# Patient Record
Sex: Female | Born: 1960
Health system: Southern US, Community
[De-identification: ages and names within clinical notes are randomized; demographics above are authoritative.]

## PROBLEM LIST (undated history)

## (undated) DIAGNOSIS — I319 Disease of pericardium, unspecified: Secondary | ICD-10-CM

## (undated) DIAGNOSIS — F419 Anxiety disorder, unspecified: Secondary | ICD-10-CM

## (undated) DIAGNOSIS — F329 Major depressive disorder, single episode, unspecified: Secondary | ICD-10-CM

## (undated) DIAGNOSIS — O99419 Diseases of the circulatory system complicating pregnancy, unspecified trimester: Secondary | ICD-10-CM

## (undated) DIAGNOSIS — M1990 Unspecified osteoarthritis, unspecified site: Secondary | ICD-10-CM

## (undated) DIAGNOSIS — L659 Nonscarring hair loss, unspecified: Secondary | ICD-10-CM

## (undated) DIAGNOSIS — F32A Depression, unspecified: Secondary | ICD-10-CM

## (undated) HISTORY — PX: BREAST BIOPSY: SHX20

## (undated) HISTORY — PX: SALIVARY GLAND SURGERY: SHX768

---

## 1990-12-13 HISTORY — PX: NASAL POLYP SURGERY: SHX186

## 1994-12-13 DIAGNOSIS — I319 Disease of pericardium, unspecified: Secondary | ICD-10-CM

## 1994-12-13 DIAGNOSIS — O99419 Diseases of the circulatory system complicating pregnancy, unspecified trimester: Secondary | ICD-10-CM

## 1994-12-13 HISTORY — DX: Disease of pericardium, unspecified: I31.9

## 1994-12-13 HISTORY — DX: Diseases of the circulatory system complicating pregnancy, unspecified trimester: O99.419

## 1998-12-13 HISTORY — PX: GANGLION CYST EXCISION: SHX1691

## 1999-01-30 ENCOUNTER — Other Ambulatory Visit: Admission: RE | Admit: 1999-01-30 | Discharge: 1999-01-30 | Payer: Self-pay | Admitting: Obstetrics and Gynecology

## 1999-06-02 ENCOUNTER — Ambulatory Visit (HOSPITAL_COMMUNITY): Admission: RE | Admit: 1999-06-02 | Discharge: 1999-06-02 | Payer: Self-pay | Admitting: Obstetrics and Gynecology

## 1999-06-08 ENCOUNTER — Ambulatory Visit (HOSPITAL_COMMUNITY): Admission: RE | Admit: 1999-06-08 | Discharge: 1999-06-08 | Payer: Self-pay | Admitting: Obstetrics and Gynecology

## 1999-12-28 ENCOUNTER — Ambulatory Visit (HOSPITAL_COMMUNITY): Admission: RE | Admit: 1999-12-28 | Discharge: 1999-12-28 | Payer: Self-pay | Admitting: Obstetrics and Gynecology

## 1999-12-28 ENCOUNTER — Encounter: Payer: Self-pay | Admitting: Obstetrics and Gynecology

## 2000-04-14 ENCOUNTER — Other Ambulatory Visit: Admission: RE | Admit: 2000-04-14 | Discharge: 2000-04-14 | Payer: Self-pay | Admitting: Obstetrics and Gynecology

## 2000-06-07 ENCOUNTER — Encounter (INDEPENDENT_AMBULATORY_CARE_PROVIDER_SITE_OTHER): Payer: Self-pay | Admitting: Specialist

## 2000-06-07 ENCOUNTER — Other Ambulatory Visit: Admission: RE | Admit: 2000-06-07 | Discharge: 2000-06-07 | Payer: Self-pay | Admitting: Obstetrics and Gynecology

## 2000-12-30 ENCOUNTER — Ambulatory Visit (HOSPITAL_COMMUNITY): Admission: RE | Admit: 2000-12-30 | Discharge: 2000-12-30 | Payer: Self-pay | Admitting: Obstetrics and Gynecology

## 2000-12-30 ENCOUNTER — Encounter: Payer: Self-pay | Admitting: Obstetrics and Gynecology

## 2001-08-09 ENCOUNTER — Other Ambulatory Visit: Admission: RE | Admit: 2001-08-09 | Discharge: 2001-08-09 | Payer: Self-pay | Admitting: Obstetrics and Gynecology

## 2002-01-23 ENCOUNTER — Encounter: Payer: Self-pay | Admitting: Obstetrics and Gynecology

## 2002-01-23 ENCOUNTER — Ambulatory Visit (HOSPITAL_COMMUNITY): Admission: RE | Admit: 2002-01-23 | Discharge: 2002-01-23 | Payer: Self-pay | Admitting: Obstetrics and Gynecology

## 2003-02-05 ENCOUNTER — Ambulatory Visit (HOSPITAL_COMMUNITY): Admission: RE | Admit: 2003-02-05 | Discharge: 2003-02-05 | Payer: Self-pay | Admitting: Obstetrics and Gynecology

## 2003-02-05 ENCOUNTER — Encounter: Payer: Self-pay | Admitting: Obstetrics and Gynecology

## 2003-02-14 ENCOUNTER — Encounter: Payer: Self-pay | Admitting: Otolaryngology

## 2003-02-14 ENCOUNTER — Encounter: Admission: RE | Admit: 2003-02-14 | Discharge: 2003-02-14 | Payer: Self-pay | Admitting: Otolaryngology

## 2003-02-20 ENCOUNTER — Other Ambulatory Visit: Admission: RE | Admit: 2003-02-20 | Discharge: 2003-02-20 | Payer: Self-pay | Admitting: Otolaryngology

## 2004-02-10 ENCOUNTER — Ambulatory Visit (HOSPITAL_COMMUNITY): Admission: RE | Admit: 2004-02-10 | Discharge: 2004-02-10 | Payer: Self-pay | Admitting: Obstetrics and Gynecology

## 2005-02-25 ENCOUNTER — Ambulatory Visit (HOSPITAL_COMMUNITY): Admission: RE | Admit: 2005-02-25 | Discharge: 2005-02-25 | Payer: Self-pay | Admitting: Obstetrics and Gynecology

## 2005-04-23 ENCOUNTER — Encounter: Admission: RE | Admit: 2005-04-23 | Discharge: 2005-04-23 | Payer: Self-pay | Admitting: Specialist

## 2006-03-24 ENCOUNTER — Ambulatory Visit (HOSPITAL_COMMUNITY): Admission: RE | Admit: 2006-03-24 | Discharge: 2006-03-24 | Payer: Self-pay | Admitting: Obstetrics and Gynecology

## 2007-05-10 ENCOUNTER — Ambulatory Visit (HOSPITAL_COMMUNITY): Admission: RE | Admit: 2007-05-10 | Discharge: 2007-05-10 | Payer: Self-pay | Admitting: Obstetrics and Gynecology

## 2008-05-20 ENCOUNTER — Ambulatory Visit (HOSPITAL_COMMUNITY): Admission: RE | Admit: 2008-05-20 | Discharge: 2008-05-20 | Payer: Self-pay | Admitting: Obstetrics and Gynecology

## 2009-12-25 ENCOUNTER — Ambulatory Visit (HOSPITAL_COMMUNITY): Admission: RE | Admit: 2009-12-25 | Discharge: 2009-12-25 | Payer: Self-pay | Admitting: Obstetrics and Gynecology

## 2010-01-02 ENCOUNTER — Encounter: Admission: RE | Admit: 2010-01-02 | Discharge: 2010-01-02 | Payer: Self-pay | Admitting: Obstetrics and Gynecology

## 2011-01-03 ENCOUNTER — Encounter: Payer: Self-pay | Admitting: Obstetrics and Gynecology

## 2011-02-02 ENCOUNTER — Other Ambulatory Visit (HOSPITAL_COMMUNITY): Payer: Self-pay | Admitting: Obstetrics and Gynecology

## 2011-02-02 DIAGNOSIS — Z1231 Encounter for screening mammogram for malignant neoplasm of breast: Secondary | ICD-10-CM

## 2011-02-17 ENCOUNTER — Ambulatory Visit (HOSPITAL_COMMUNITY)
Admission: RE | Admit: 2011-02-17 | Discharge: 2011-02-17 | Disposition: A | Discharge status: Home / Self Care | Payer: Managed Care, Other (non HMO) | Source: Ambulatory Visit | Attending: Obstetrics and Gynecology | Admitting: Obstetrics and Gynecology

## 2011-02-17 DIAGNOSIS — Z1231 Encounter for screening mammogram for malignant neoplasm of breast: Secondary | ICD-10-CM | POA: Insufficient documentation

## 2012-02-17 ENCOUNTER — Other Ambulatory Visit (HOSPITAL_COMMUNITY): Payer: Self-pay | Admitting: Obstetrics and Gynecology

## 2012-02-17 DIAGNOSIS — Z1231 Encounter for screening mammogram for malignant neoplasm of breast: Secondary | ICD-10-CM

## 2012-03-15 ENCOUNTER — Ambulatory Visit (HOSPITAL_COMMUNITY): Payer: Managed Care, Other (non HMO)

## 2012-03-21 ENCOUNTER — Ambulatory Visit (HOSPITAL_COMMUNITY)
Admission: RE | Admit: 2012-03-21 | Discharge: 2012-03-21 | Disposition: A | Discharge status: Home / Self Care | Payer: Managed Care, Other (non HMO) | Source: Ambulatory Visit | Attending: Obstetrics and Gynecology | Admitting: Obstetrics and Gynecology

## 2012-03-21 DIAGNOSIS — Z1231 Encounter for screening mammogram for malignant neoplasm of breast: Secondary | ICD-10-CM

## 2013-05-10 ENCOUNTER — Other Ambulatory Visit (HOSPITAL_COMMUNITY): Payer: Self-pay | Admitting: Obstetrics and Gynecology

## 2013-05-10 DIAGNOSIS — Z1231 Encounter for screening mammogram for malignant neoplasm of breast: Secondary | ICD-10-CM

## 2013-05-18 ENCOUNTER — Ambulatory Visit (HOSPITAL_COMMUNITY): Payer: Managed Care, Other (non HMO) | Attending: Obstetrics and Gynecology

## 2013-06-14 ENCOUNTER — Ambulatory Visit (HOSPITAL_COMMUNITY)
Admission: RE | Admit: 2013-06-14 | Discharge: 2013-06-14 | Disposition: A | Discharge status: Home / Self Care | Payer: Federal, State, Local not specified - PPO | Source: Ambulatory Visit | Attending: Obstetrics and Gynecology | Admitting: Obstetrics and Gynecology

## 2013-06-14 DIAGNOSIS — Z1231 Encounter for screening mammogram for malignant neoplasm of breast: Secondary | ICD-10-CM | POA: Insufficient documentation

## 2014-06-27 ENCOUNTER — Other Ambulatory Visit (HOSPITAL_COMMUNITY): Payer: Self-pay | Admitting: Obstetrics and Gynecology

## 2014-06-27 DIAGNOSIS — Z1231 Encounter for screening mammogram for malignant neoplasm of breast: Secondary | ICD-10-CM

## 2014-07-02 ENCOUNTER — Ambulatory Visit (HOSPITAL_COMMUNITY)
Admission: RE | Admit: 2014-07-02 | Discharge: 2014-07-02 | Disposition: A | Discharge status: Home / Self Care | Payer: Federal, State, Local not specified - PPO | Source: Ambulatory Visit | Attending: Obstetrics and Gynecology | Admitting: Obstetrics and Gynecology

## 2014-07-02 DIAGNOSIS — Z1231 Encounter for screening mammogram for malignant neoplasm of breast: Secondary | ICD-10-CM | POA: Insufficient documentation

## 2015-08-14 ENCOUNTER — Telehealth: Payer: Self-pay

## 2015-08-21 ENCOUNTER — Other Ambulatory Visit (HOSPITAL_COMMUNITY): Payer: Self-pay | Admitting: Obstetrics and Gynecology

## 2015-08-21 DIAGNOSIS — Z1231 Encounter for screening mammogram for malignant neoplasm of breast: Secondary | ICD-10-CM

## 2015-08-29 ENCOUNTER — Other Ambulatory Visit (HOSPITAL_COMMUNITY): Payer: Self-pay | Admitting: Obstetrics and Gynecology

## 2015-08-29 ENCOUNTER — Ambulatory Visit (HOSPITAL_COMMUNITY)
Admission: RE | Admit: 2015-08-29 | Discharge: 2015-08-29 | Disposition: A | Discharge status: Home / Self Care | Payer: Federal, State, Local not specified - PPO | Source: Ambulatory Visit | Attending: Obstetrics and Gynecology | Admitting: Obstetrics and Gynecology

## 2015-08-29 DIAGNOSIS — Z1231 Encounter for screening mammogram for malignant neoplasm of breast: Secondary | ICD-10-CM

## 2016-04-23 NOTE — Telephone Encounter (Signed)
close

## 2016-08-24 ENCOUNTER — Other Ambulatory Visit: Payer: Self-pay | Admitting: Obstetrics and Gynecology

## 2016-08-24 DIAGNOSIS — Z1231 Encounter for screening mammogram for malignant neoplasm of breast: Secondary | ICD-10-CM

## 2016-09-01 ENCOUNTER — Ambulatory Visit: Payer: Managed Care, Other (non HMO)

## 2016-09-07 ENCOUNTER — Ambulatory Visit
Admission: RE | Admit: 2016-09-07 | Discharge: 2016-09-07 | Disposition: A | Discharge status: Home / Self Care | Payer: Federal, State, Local not specified - PPO | Source: Ambulatory Visit | Attending: Obstetrics and Gynecology | Admitting: Obstetrics and Gynecology

## 2016-09-07 DIAGNOSIS — Z1231 Encounter for screening mammogram for malignant neoplasm of breast: Secondary | ICD-10-CM

## 2016-10-20 DIAGNOSIS — Z6827 Body mass index (BMI) 27.0-27.9, adult: Secondary | ICD-10-CM | POA: Diagnosis not present

## 2016-10-20 DIAGNOSIS — Z01419 Encounter for gynecological examination (general) (routine) without abnormal findings: Secondary | ICD-10-CM | POA: Diagnosis not present

## 2016-10-27 DIAGNOSIS — Z13220 Encounter for screening for lipoid disorders: Secondary | ICD-10-CM | POA: Diagnosis not present

## 2016-10-27 DIAGNOSIS — Z131 Encounter for screening for diabetes mellitus: Secondary | ICD-10-CM | POA: Diagnosis not present

## 2016-10-27 DIAGNOSIS — M722 Plantar fascial fibromatosis: Secondary | ICD-10-CM | POA: Diagnosis not present

## 2016-11-01 DIAGNOSIS — M722 Plantar fascial fibromatosis: Secondary | ICD-10-CM | POA: Diagnosis not present

## 2016-11-09 DIAGNOSIS — D2271 Melanocytic nevi of right lower limb, including hip: Secondary | ICD-10-CM | POA: Diagnosis not present

## 2016-11-09 DIAGNOSIS — D224 Melanocytic nevi of scalp and neck: Secondary | ICD-10-CM | POA: Diagnosis not present

## 2016-11-09 DIAGNOSIS — D2230 Melanocytic nevi of unspecified part of face: Secondary | ICD-10-CM | POA: Diagnosis not present

## 2016-11-09 DIAGNOSIS — D225 Melanocytic nevi of trunk: Secondary | ICD-10-CM | POA: Diagnosis not present

## 2016-11-09 DIAGNOSIS — L639 Alopecia areata, unspecified: Secondary | ICD-10-CM | POA: Diagnosis not present

## 2016-11-10 DIAGNOSIS — M722 Plantar fascial fibromatosis: Secondary | ICD-10-CM | POA: Diagnosis not present

## 2016-11-17 DIAGNOSIS — M722 Plantar fascial fibromatosis: Secondary | ICD-10-CM | POA: Diagnosis not present

## 2016-11-24 DIAGNOSIS — M722 Plantar fascial fibromatosis: Secondary | ICD-10-CM | POA: Diagnosis not present

## 2016-11-29 DIAGNOSIS — M722 Plantar fascial fibromatosis: Secondary | ICD-10-CM | POA: Diagnosis not present

## 2016-12-17 DIAGNOSIS — M722 Plantar fascial fibromatosis: Secondary | ICD-10-CM | POA: Diagnosis not present

## 2016-12-17 DIAGNOSIS — G8929 Other chronic pain: Secondary | ICD-10-CM | POA: Diagnosis not present

## 2016-12-17 DIAGNOSIS — M79675 Pain in left toe(s): Secondary | ICD-10-CM | POA: Diagnosis not present

## 2016-12-23 DIAGNOSIS — L639 Alopecia areata, unspecified: Secondary | ICD-10-CM | POA: Diagnosis not present

## 2017-01-20 ENCOUNTER — Other Ambulatory Visit: Payer: Self-pay | Admitting: Orthopedic Surgery

## 2017-02-10 ENCOUNTER — Encounter (HOSPITAL_BASED_OUTPATIENT_CLINIC_OR_DEPARTMENT_OTHER): Payer: Self-pay | Admitting: *Deleted

## 2017-02-11 ENCOUNTER — Encounter (HOSPITAL_BASED_OUTPATIENT_CLINIC_OR_DEPARTMENT_OTHER)
Admission: RE | Admit: 2017-02-11 | Discharge: 2017-02-11 | Disposition: A | Discharge status: Home / Self Care | Payer: Federal, State, Local not specified - PPO | Source: Ambulatory Visit | Attending: Orthopedic Surgery | Admitting: Orthopedic Surgery

## 2017-02-11 DIAGNOSIS — Z01812 Encounter for preprocedural laboratory examination: Secondary | ICD-10-CM | POA: Diagnosis not present

## 2017-02-11 LAB — BASIC METABOLIC PANEL
ANION GAP: 7 (ref 5–15)
BUN: 7 mg/dL (ref 6–20)
CALCIUM: 8.9 mg/dL (ref 8.9–10.3)
CO2: 26 mmol/L (ref 22–32)
Chloride: 103 mmol/L (ref 101–111)
Creatinine, Ser: 0.69 mg/dL (ref 0.44–1.00)
Glucose, Bld: 85 mg/dL (ref 65–99)
Potassium: 3.9 mmol/L (ref 3.5–5.1)
SODIUM: 136 mmol/L (ref 135–145)

## 2017-02-17 ENCOUNTER — Ambulatory Visit (HOSPITAL_BASED_OUTPATIENT_CLINIC_OR_DEPARTMENT_OTHER): Payer: Federal, State, Local not specified - PPO | Admitting: Certified Registered"

## 2017-02-17 ENCOUNTER — Encounter (HOSPITAL_BASED_OUTPATIENT_CLINIC_OR_DEPARTMENT_OTHER): Payer: Self-pay | Admitting: Certified Registered"

## 2017-02-17 ENCOUNTER — Ambulatory Visit (HOSPITAL_BASED_OUTPATIENT_CLINIC_OR_DEPARTMENT_OTHER)
Admission: RE | Admit: 2017-02-17 | Discharge: 2017-02-17 | Disposition: A | Discharge status: Home / Self Care | Payer: Federal, State, Local not specified - PPO | Source: Ambulatory Visit | Attending: Orthopedic Surgery | Admitting: Orthopedic Surgery

## 2017-02-17 ENCOUNTER — Encounter (HOSPITAL_BASED_OUTPATIENT_CLINIC_OR_DEPARTMENT_OTHER)
Admission: RE | Disposition: A | Discharge status: Home / Self Care | Payer: Self-pay | Source: Ambulatory Visit | Attending: Orthopedic Surgery

## 2017-02-17 DIAGNOSIS — Z7952 Long term (current) use of systemic steroids: Secondary | ICD-10-CM | POA: Insufficient documentation

## 2017-02-17 DIAGNOSIS — M19072 Primary osteoarthritis, left ankle and foot: Secondary | ICD-10-CM | POA: Diagnosis not present

## 2017-02-17 DIAGNOSIS — Z79899 Other long term (current) drug therapy: Secondary | ICD-10-CM | POA: Insufficient documentation

## 2017-02-17 DIAGNOSIS — F329 Major depressive disorder, single episode, unspecified: Secondary | ICD-10-CM | POA: Insufficient documentation

## 2017-02-17 DIAGNOSIS — M1712 Unilateral primary osteoarthritis, left knee: Secondary | ICD-10-CM | POA: Diagnosis not present

## 2017-02-17 DIAGNOSIS — M2022 Hallux rigidus, left foot: Secondary | ICD-10-CM | POA: Insufficient documentation

## 2017-02-17 DIAGNOSIS — F419 Anxiety disorder, unspecified: Secondary | ICD-10-CM | POA: Diagnosis not present

## 2017-02-17 DIAGNOSIS — Z880 Allergy status to penicillin: Secondary | ICD-10-CM | POA: Insufficient documentation

## 2017-02-17 DIAGNOSIS — L659 Nonscarring hair loss, unspecified: Secondary | ICD-10-CM | POA: Diagnosis not present

## 2017-02-17 DIAGNOSIS — G8918 Other acute postprocedural pain: Secondary | ICD-10-CM | POA: Diagnosis not present

## 2017-02-17 DIAGNOSIS — Z87891 Personal history of nicotine dependence: Secondary | ICD-10-CM | POA: Insufficient documentation

## 2017-02-17 DIAGNOSIS — Z9889 Other specified postprocedural states: Secondary | ICD-10-CM

## 2017-02-17 HISTORY — DX: Unspecified osteoarthritis, unspecified site: M19.90

## 2017-02-17 HISTORY — DX: Disease of pericardium, unspecified: I31.9

## 2017-02-17 HISTORY — DX: Depression, unspecified: F32.A

## 2017-02-17 HISTORY — DX: Nonscarring hair loss, unspecified: L65.9

## 2017-02-17 HISTORY — DX: Major depressive disorder, single episode, unspecified: F32.9

## 2017-02-17 HISTORY — DX: Diseases of the circulatory system complicating pregnancy, unspecified trimester: O99.419

## 2017-02-17 HISTORY — DX: Anxiety disorder, unspecified: F41.9

## 2017-02-17 SURGERY — CHEILECTOMY, GREAT TOE, WITH IMPLANT INSERTION
Anesthesia: General | Site: Foot | Laterality: Left

## 2017-02-17 MED ORDER — PROPOFOL 10 MG/ML IV BOLUS
INTRAVENOUS | Status: DC | PRN
  Administered 2017-02-17: 150 mg via INTRAVENOUS

## 2017-02-17 MED ORDER — MIDAZOLAM HCL 2 MG/2ML IJ SOLN
1.0000 mg | INTRAMUSCULAR | Status: DC | PRN
  Administered 2017-02-17: 2 mg via INTRAVENOUS

## 2017-02-17 MED ORDER — CHLORHEXIDINE GLUCONATE 4 % EX LIQD: 60.0000 mL | Freq: Once | CUTANEOUS | Status: DC

## 2017-02-17 MED ORDER — ONDANSETRON HCL 4 MG/2ML IJ SOLN
INTRAMUSCULAR | Status: DC | PRN
  Administered 2017-02-17: 4 mg via INTRAVENOUS

## 2017-02-17 MED ORDER — DEXAMETHASONE SODIUM PHOSPHATE 10 MG/ML IJ SOLN
INTRAMUSCULAR | Status: DC | PRN
  Administered 2017-02-17: 10 mg via INTRAVENOUS

## 2017-02-17 MED ORDER — CEFAZOLIN SODIUM-DEXTROSE 2-4 GM/100ML-% IV SOLN
2.0000 g | INTRAVENOUS | Status: AC
  Administered 2017-02-17: 2 g via INTRAVENOUS

## 2017-02-17 MED ORDER — MIDAZOLAM HCL 2 MG/2ML IJ SOLN
INTRAMUSCULAR | Status: AC
  Filled 2017-02-17: qty 2

## 2017-02-17 MED ORDER — BUPIVACAINE-EPINEPHRINE (PF) 0.5% -1:200000 IJ SOLN
INTRAMUSCULAR | Status: DC | PRN
  Administered 2017-02-17: 30 mL via PERINEURAL

## 2017-02-17 MED ORDER — FENTANYL CITRATE (PF) 100 MCG/2ML IJ SOLN
50.0000 ug | INTRAMUSCULAR | Status: DC | PRN
  Administered 2017-02-17: 100 ug via INTRAVENOUS

## 2017-02-17 MED ORDER — MEPERIDINE HCL 25 MG/ML IJ SOLN: 6.2500 mg | INTRAMUSCULAR | Status: DC | PRN

## 2017-02-17 MED ORDER — LACTATED RINGERS IV SOLN
INTRAVENOUS | Status: DC
  Administered 2017-02-17 (×2): via INTRAVENOUS

## 2017-02-17 MED ORDER — SODIUM CHLORIDE 0.9 % IV SOLN: INTRAVENOUS | Status: DC

## 2017-02-17 MED ORDER — HYDROCODONE-ACETAMINOPHEN 7.5-325 MG PO TABS: 1.0000 | ORAL_TABLET | Freq: Once | ORAL | Status: DC | PRN

## 2017-02-17 MED ORDER — CEFAZOLIN SODIUM-DEXTROSE 2-4 GM/100ML-% IV SOLN
INTRAVENOUS | Status: AC
  Filled 2017-02-17: qty 100

## 2017-02-17 MED ORDER — SENNA 8.6 MG PO TABS
2.0000 | ORAL_TABLET | Freq: Two times a day (BID) | ORAL | 0 refills | Status: AC
Start: 2017-02-17 — End: ?

## 2017-02-17 MED ORDER — FENTANYL CITRATE (PF) 100 MCG/2ML IJ SOLN
INTRAMUSCULAR | Status: AC
  Filled 2017-02-17: qty 2

## 2017-02-17 MED ORDER — ROPIVACAINE HCL 5 MG/ML IJ SOLN
INTRAMUSCULAR | Status: DC | PRN
  Administered 2017-02-17: 20 mL via PERINEURAL

## 2017-02-17 MED ORDER — 0.9 % SODIUM CHLORIDE (POUR BTL) OPTIME
TOPICAL | Status: DC | PRN
  Administered 2017-02-17: 120 mL

## 2017-02-17 MED ORDER — DOCUSATE SODIUM 100 MG PO CAPS: 100.0000 mg | ORAL_CAPSULE | Freq: Two times a day (BID) | ORAL | 0 refills | Status: AC

## 2017-02-17 MED ORDER — OXYCODONE HCL 5 MG PO TABS: 5.0000 mg | ORAL_TABLET | ORAL | 0 refills | Status: AC | PRN

## 2017-02-17 MED ORDER — METOCLOPRAMIDE HCL 5 MG/ML IJ SOLN: 10.0000 mg | Freq: Once | INTRAMUSCULAR | Status: DC | PRN

## 2017-02-17 MED ORDER — LIDOCAINE 2% (20 MG/ML) 5 ML SYRINGE
INTRAMUSCULAR | Status: DC | PRN
  Administered 2017-02-17: 30 mg via INTRAVENOUS

## 2017-02-17 MED ORDER — FENTANYL CITRATE (PF) 100 MCG/2ML IJ SOLN: 25.0000 ug | INTRAMUSCULAR | Status: DC | PRN

## 2017-02-17 MED ORDER — SCOPOLAMINE 1 MG/3DAYS TD PT72: 1.0000 | MEDICATED_PATCH | Freq: Once | TRANSDERMAL | Status: DC | PRN

## 2017-02-17 SURGICAL SUPPLY — 61 items
BANDAGE ESMARK 6X9 LF (GAUZE/BANDAGES/DRESSINGS) IMPLANT
BLADE SURG 15 STRL LF DISP TIS (BLADE) ×2 IMPLANT
BLADE SURG 15 STRL SS (BLADE) ×4
BNDG CMPR 9X4 STRL LF SNTH (GAUZE/BANDAGES/DRESSINGS)
BNDG CMPR 9X6 STRL LF SNTH (GAUZE/BANDAGES/DRESSINGS)
BNDG COHESIVE 4X5 TAN STRL (GAUZE/BANDAGES/DRESSINGS) ×2 IMPLANT
BNDG CONFORM 2 STRL LF (GAUZE/BANDAGES/DRESSINGS) ×1 IMPLANT
BNDG CONFORM 3 STRL LF (GAUZE/BANDAGES/DRESSINGS) ×1 IMPLANT
BNDG ESMARK 4X9 LF (GAUZE/BANDAGES/DRESSINGS) ×1 IMPLANT
BNDG ESMARK 6X9 LF (GAUZE/BANDAGES/DRESSINGS)
CHLORAPREP W/TINT 26ML (MISCELLANEOUS) ×2 IMPLANT
COVER BACK TABLE 60X90IN (DRAPES) ×2 IMPLANT
CUFF TOURNIQUET SINGLE 18IN (TOURNIQUET CUFF) IMPLANT
CUFF TOURNIQUET SINGLE 24IN (TOURNIQUET CUFF) ×1 IMPLANT
CUFF TOURNIQUET SINGLE 34IN LL (TOURNIQUET CUFF) IMPLANT
DRAPE EXTREMITY T 121X128X90 (DRAPE) ×2 IMPLANT
DRAPE OEC MINIVIEW 54X84 (DRAPES) IMPLANT
DRAPE SURG 17X23 STRL (DRAPES) IMPLANT
DRAPE U-SHAPE 47X51 STRL (DRAPES) ×1 IMPLANT
DRSG MEPITEL 4X7.2 (GAUZE/BANDAGES/DRESSINGS) ×2 IMPLANT
DRSG PAD ABDOMINAL 8X10 ST (GAUZE/BANDAGES/DRESSINGS) ×2 IMPLANT
ELECT REM PT RETURN 9FT ADLT (ELECTROSURGICAL) ×2
ELECTRODE REM PT RTRN 9FT ADLT (ELECTROSURGICAL) ×1 IMPLANT
GAUZE SPONGE 4X4 12PLY STRL (GAUZE/BANDAGES/DRESSINGS) ×2 IMPLANT
GLOVE BIO SURGEON STRL SZ8 (GLOVE) ×2 IMPLANT
GLOVE BIOGEL PI IND STRL 7.0 (GLOVE) IMPLANT
GLOVE BIOGEL PI IND STRL 8 (GLOVE) ×2 IMPLANT
GLOVE BIOGEL PI INDICATOR 7.0 (GLOVE) ×2
GLOVE BIOGEL PI INDICATOR 8 (GLOVE) ×2
GLOVE ECLIPSE 6.5 STRL STRAW (GLOVE) ×1 IMPLANT
GLOVE ECLIPSE 8.0 STRL XLNG CF (GLOVE) ×2 IMPLANT
GOWN STRL REUS W/ TWL LRG LVL3 (GOWN DISPOSABLE) ×1 IMPLANT
GOWN STRL REUS W/ TWL XL LVL3 (GOWN DISPOSABLE) ×2 IMPLANT
GOWN STRL REUS W/TWL LRG LVL3 (GOWN DISPOSABLE) ×2
GOWN STRL REUS W/TWL XL LVL3 (GOWN DISPOSABLE) ×4
IMPL MTP CARTIVA 10MM (Orthopedic Implant) IMPLANT
IMPLANT MTP CARTIVA 10MM (Orthopedic Implant) ×2 IMPLANT
NDL HYPO 25X1 1.5 SAFETY (NEEDLE) IMPLANT
NEEDLE HYPO 25X1 1.5 SAFETY (NEEDLE) IMPLANT
NS IRRIG 1000ML POUR BTL (IV SOLUTION) ×2 IMPLANT
PACK BASIN DAY SURGERY FS (CUSTOM PROCEDURE TRAY) ×2 IMPLANT
PAD CAST 4YDX4 CTTN HI CHSV (CAST SUPPLIES) ×1 IMPLANT
PADDING CAST COTTON 4X4 STRL (CAST SUPPLIES) ×2
PENCIL BUTTON HOLSTER BLD 10FT (ELECTRODE) ×2 IMPLANT
SANITIZER HAND PURELL 535ML FO (MISCELLANEOUS) ×2 IMPLANT
SHEET MEDIUM DRAPE 40X70 STRL (DRAPES) ×2 IMPLANT
SLEEVE SCD COMPRESS KNEE MED (MISCELLANEOUS) ×2 IMPLANT
SPONGE LAP 18X18 X RAY DECT (DISPOSABLE) ×2 IMPLANT
STOCKINETTE 6  STRL (DRAPES) ×1
STOCKINETTE 6 STRL (DRAPES) ×1 IMPLANT
SUCTION FRAZIER HANDLE 10FR (MISCELLANEOUS) ×1
SUCTION TUBE FRAZIER 10FR DISP (MISCELLANEOUS) ×1 IMPLANT
SUT ETHILON 3 0 PS 1 (SUTURE) ×2 IMPLANT
SUT MNCRL AB 3-0 PS2 18 (SUTURE) ×2 IMPLANT
SUT VIC AB 2-0 SH 27 (SUTURE) ×2
SUT VIC AB 2-0 SH 27XBRD (SUTURE) IMPLANT
SYR BULB 3OZ (MISCELLANEOUS) ×2 IMPLANT
SYR CONTROL 10ML LL (SYRINGE) IMPLANT
TOWEL OR 17X24 6PK STRL BLUE (TOWEL DISPOSABLE) ×2 IMPLANT
TUBE CONNECTING 20X1/4 (TUBING) ×2 IMPLANT
UNDERPAD 30X30 (UNDERPADS AND DIAPERS) ×2 IMPLANT

## 2017-02-17 NOTE — Anesthesia Procedure Notes (Signed)
Procedure Name: LMA Insertion Date/Time: 02/17/2017 7:35 AM Performed by: Vonzella Althaus D Pre-anesthesia Checklist: Patient identified, Emergency Drugs available, Suction available and Patient being monitored Patient Re-evaluated:Patient Re-evaluated prior to inductionOxygen Delivery Method: Circle system utilized Preoxygenation: Pre-oxygenation with 100% oxygen Intubation Type: IV induction Ventilation: Mask ventilation without difficulty LMA: LMA inserted LMA Size: 3.0 Number of attempts: 1 Airway Equipment and Method: Bite block Placement Confirmation: positive ETCO2 Tube secured with: Tape Dental Injury: Teeth and Oropharynx as per pre-operative assessment

## 2017-02-17 NOTE — Anesthesia Preprocedure Evaluation (Addendum)
Anesthesia Evaluation  Patient identified by MRN, date of birth, ID band Patient awake    Reviewed: Allergy & Precautions, NPO status , Patient's Chart, lab work & pertinent test results  Airway Mallampati: II  TM Distance: >3 FB Neck ROM: Full    Dental no notable dental hx. (+) Teeth Intact   Pulmonary former smoker,    Pulmonary exam normal breath sounds clear to auscultation       Cardiovascular negative cardio ROS Normal cardiovascular exam Rhythm:Regular Rate:Normal     Neuro/Psych PSYCHIATRIC DISORDERS Anxiety Depression    GI/Hepatic negative GI ROS, Neg liver ROS,   Endo/Other  negative endocrine ROS  Renal/GU negative Renal ROS  negative genitourinary   Musculoskeletal  (+) Arthritis , Osteoarthritis,  Alopecia-autoimmune   Abdominal   Peds  Hematology negative hematology ROS (+)   Anesthesia Other Findings   Reproductive/Obstetrics                           Anesthesia Physical Anesthesia Plan  ASA: II  Anesthesia Plan: General and Regional   Post-op Pain Management:  Regional for Post-op pain   Induction: Intravenous  Airway Management Planned: LMA  Additional Equipment:   Intra-op Plan:   Post-operative Plan: Extubation in OR  Informed Consent: I have reviewed the patients History and Physical, chart, labs and discussed the procedure including the risks, benefits and alternatives for the proposed anesthesia with the patient or authorized representative who has indicated his/her understanding and acceptance.   Dental advisory given  Plan Discussed with: CRNA, Anesthesiologist and Surgeon  Anesthesia Plan Comments:        Anesthesia Quick Evaluation

## 2017-02-17 NOTE — Anesthesia Postprocedure Evaluation (Signed)
Anesthesia Post Note  Patient: Stephanie Mccoy  Procedure(s) Performed: Procedure(s) (LRB): CHEILECTOMY WITH CARTIVA RESURFACING (Left)  Patient location during evaluation: PACU Anesthesia Type: General Level of consciousness: awake and alert and oriented Pain management: pain level controlled Vital Signs Assessment: post-procedure vital signs reviewed and stable Respiratory status: spontaneous breathing, nonlabored ventilation and respiratory function stable Cardiovascular status: blood pressure returned to baseline and stable Postop Assessment: no signs of nausea or vomiting Anesthetic complications: no       Last Vitals:  Vitals:   02/17/17 0830 02/17/17 0903  BP: 124/80 125/68  Pulse: 60 65  Resp: 13 18  Temp:  36.5 C    Last Pain:  Vitals:   02/17/17 0903  TempSrc:   PainSc: 0-No pain    LLE Motor Response: No movement due to regional block (02/17/17 0903) LLE Sensation: No pain;No numbness;No tingling;No sensation (absent) (02/17/17 0903)          Aurianna Earlywine A.

## 2017-02-17 NOTE — H&P (Signed)
Stephanie Mccoy is an 56 y.o. female.   Chief Complaint: left foot pain HPI: 56 y/o female with left hallux rigidus.  She has failed non op treatment and presents today for surgery.  Past Medical History:  Diagnosis Date  . Alopecia    autoimmune- gets cortisone injections into scalp every 2 months- last inj 12/23/2016  . Anxiety   . Arthritis    left knee, left toe  . Depression   . Pericarditis of mother during pregnancy 1996   no problems since delivery    Past Surgical History:  Procedure Laterality Date  . GANGLION CYST EXCISION Left 2000  . NASAL POLYP SURGERY Left 1992  . SALIVARY GLAND SURGERY Right ~15 years ago    History reviewed. No pertinent family history. Social History:  reports that she quit smoking about 34 years ago. Her smoking use included Cigarettes. She has a 1.00 pack-year smoking history. She has never used smokeless tobacco. She reports that she drinks alcohol. She reports that she does not use drugs.  Allergies:  Allergies  Allergen Reactions  . Penicillins Swelling    As a child, per pt's mother  Rxn "swelling in throat"    Medications Prior to Admission  Medication Sig Dispense Refill  . buPROPion (WELLBUTRIN XL) 150 MG 24 hr tablet Take 150 mg by mouth at bedtime.    Marland Kitchen. doxycycline (ORACEA) 40 MG capsule Take 40 mg by mouth every morning.    . triamterene-hydrochlorothiazide (DYAZIDE) 37.5-25 MG capsule Take 1 capsule by mouth as needed.    . venlafaxine XR (EFFEXOR-XR) 75 MG 24 hr capsule Take 75 mg by mouth at bedtime.    . triamcinolone acetonide (KENALOG) 10 MG/ML injection Inject 5 mg into the muscle once. Pt gets 5 mg injection into scalp for alopecia      No results found for this or any previous visit (from the past 48 hour(s)). No results found.  ROS  No recent f/c/n/v/w tloss  Blood pressure 117/70, pulse 82, temperature 98.6 F (37 C), temperature source Oral, resp. rate 17, height 5' 3.5" (1.613 m), weight 71.8 kg (158 lb 4 oz),  SpO2 100 %. Physical Exam  wn wd woman in nad.  A and O x 4.  Mood and affect normal.  EOMi.  Resp unlabored.  L foot with decreased rom at thehallux MP joint.  Skin healthy and intact.  No lymphadenopathy.  5/5 strength in PF and DF of the teos and ankle.  Sens to LT intact at the forefoot.  Assessment/Plan L hallux rigidus - to OR for L hallux MPJ cheilectomy and joint resurfacing.  The risks and benefits of the alternative treatment options have been discussed in detail.  The patient wishes to proceed with surgery and specifically understands risks of bleeding, infection, nerve damage, blood clots, need for additional surgery, amputation and death.   Toni ArthursHEWITT, Deicy Rusk, MD 02/17/2017, 7:26 AM

## 2017-02-17 NOTE — Op Note (Signed)
NAMTowana Mccoy:  Stephanie Mccoy, Stephanie Mccoy              ACCOUNT NO.:  [id number]  MEDICAL RECORD NO.:  [id number]  LOCATION:                                 FACILITY:  PHYSICIAN:  Toni ArthursJohn Younique Casad, MD             DATE OF BIRTH:  DATE OF PROCEDURE:  02/17/2017 DATE OF DISCHARGE:                              OPERATIVE REPORT   PREOPERATIVE DIAGNOSIS:  Left hallux rigidus.  POSTOPERATIVE DIAGNOSIS:  Left hallux rigidus.  PROCEDURE:  Left hallux metatarsophalangeal joint cheilectomy and joint resurfacing (Cartiva).  SURGEON:  Toni ArthursJohn Khamron Gellert, MD.  ASSISTANT:  Alfredo MartinezJustin Ollis, PA-C.  ANESTHESIA:  General, regional.  ESTIMATED BLOOD LOSS:  Minimal.  TOURNIQUET TIME:  17 minutes at 250 mmHg.  COMPLICATIONS:  None apparent.  DISPOSITION:  Extubated, awake and stable to recovery.  INDICATIONS FOR PROCEDURE:  The patient is a 56 year old woman who has left forefoot pain due to hallux rigidus.  She has failed nonoperative treatment to date.  She presents today for hallux MP joint cheilectomy and joint resurfacing.  She understands the risks and benefits of the alternative treatment options and elects surgical treatment.  She specifically understands risks of bleeding, infection, nerve damage, blood clots, need for additional surgery, continued pain, amputation, and death.  PROCEDURE IN DETAIL:  After preoperative consent was obtained and the correct operative site was identified, the patient was brought to the operating room and placed supine on the operating table.  General anesthesia was induced.  Preoperative antibiotics were administered. Surgical time-out was taken.  Left lower extremity was prepped and draped in standard sterile fashion with tourniquet around the calf. Calf tourniquet was inflated to 200 mmHg.  Longitudinal incision was then made over the hallux MP joint.  Sharp dissection was carried down through the skin and subcutaneous tissue.  The extensor hallucis longus and brevis tendons were  protected throughout the case.  The dorsal joint capsule was noted to be quite inflamed.  It was incised and elevated medially and laterally.  The dorsal osteophytes were removed with a rongeur.  The sesamoid articulation was mobilized with a Multimedia programmerJoker elevator. The head of the metatarsal was exposed.  The impactor was used as a guide and the pin was inserted in the central portion of the metatarsal head just dorsal from the midline.  The reamer was then advanced over the K-wire to a depth of 1 mm or 2 shy from the subchondral bone.  The wound was irrigated copiously and all bone fragments were removed.  The 10 mm Cartiva implant was then inserted to the appropriate depth without difficulty.  The joint was then mobilized and was noted to dorsiflex approximately 45 degrees and plantar flex approximately 45 degrees. Wound was again irrigated copiously.  The dorsal joint capsule was repaired with 2-0 Vicryl.  The subcutaneous tissues were approximated with 3-0 Monocryl.  The skin incision was closed with a running 3-0 nylon.  Sterile dressings were applied followed by a compression wrap. Tourniquet was released after application of the dressings at 17 minutes.  The patient was awakened from anesthesia and transported to the recovery room in stable condition.  FOLLOWUP PLAN:  The patient will  be weightbearing as tolerated on the left foot in a flat postop shoe.  She will follow up with me in the office in 2 weeks for suture removal and to initiate physical therapy.  Alfredo Martinez, PA-C, was present and scrubbed for the duration of the case.  His assistance was essential in positioning the patient, prepping and draping, gaining and maintaining exposure, performing the operation closing and dressing the wounds.     Toni Arthurs, MD     JH/MEDQ  D:  02/17/2017  T:  02/17/2017  Job:  [id number]

## 2017-02-17 NOTE — Anesthesia Procedure Notes (Addendum)
Anesthesia Regional Block: Adductor canal block   Pre-Anesthetic Checklist: ,, timeout performed, Correct Patient, Correct Site, Correct Laterality, Correct Procedure, Correct Position, site marked, Risks and benefits discussed,  Surgical consent,  Pre-op evaluation,  At surgeon's request and post-op pain management  Laterality: Left  Prep: chloraprep       Needles:  Injection technique: Single-shot  Needle Type: Echogenic Stimulator Needle     Needle Length: 9cm  Needle Gauge: 21   Needle insertion depth: 5 cm   Additional Needles:   Procedures: ultrasound guided,,,,,,,,  Narrative:  Start time: 02/17/2017 7:18 AM End time: 02/17/2017 7:22 AM Injection made incrementally with aspirations every 5 mL.  Performed by: Personally  Anesthesiologist: Mal AmabileFOSTER, Khala Tarte  Additional Notes: Relevant anatomy ID'd using US. Incremental 3-545ml injection with frequent aspiration. Patient tolerated procedure well.

## 2017-02-17 NOTE — Anesthesia Procedure Notes (Signed)
Procedures

## 2017-02-17 NOTE — Transfer of Care (Signed)
Immediate Anesthesia Transfer of Care Note  Patient: Stephanie Mccoy  Procedure(s) Performed: Procedure(s) with comments: CHEILECTOMY WITH CARTIVA RESURFACING (Left) - requests 60mins  Patient Location: PACU  Anesthesia Type:GA combined with regional for post-op pain  Level of Consciousness: awake and patient cooperative  Airway & Oxygen Therapy: Patient Spontanous Breathing and Patient connected to face mask oxygen  Post-op Assessment: Report given to RN and Post -op Vital signs reviewed and stable  Post vital signs: Reviewed and stable  Last Vitals:  Vitals:   02/17/17 0720 02/17/17 0721  BP: 117/70   Pulse: 78 82  Resp: 14 17  Temp:      Last Pain:  Vitals:   02/17/17 0637  TempSrc: Oral  PainSc: 4       Patients Stated Pain Goal: 2 (02/17/17 [id number])  Complications: No apparent anesthesia complications

## 2017-02-17 NOTE — Brief Op Note (Signed)
02/17/2017  8:10 AM  PATIENT:  Stephanie Mccoy  56 y.o. female  PRE-OPERATIVE DIAGNOSIS:  Left hallux rigidus   POST-OPERATIVE DIAGNOSIS:  Left hallux rigidus   Procedure(s): Left hallux MPJ CHEILECTOMY WITH CARTIVA joint RESURFACING  SURGEON:  Toni ArthursJohn Meghanne Pletz, MD  ASSISTANT: Alfredo MartinezJustin Ollis, PA-C  ANESTHESIA:   General, regional  EBL:  minimal   TOURNIQUET:   Total Tourniquet Time Documented: Calf (Left) - 17 minutes Total: Calf (Left) - 17 minutes  COMPLICATIONS:  None apparent  DISPOSITION:  Extubated, awake and stable to recovery.  DICTATION ID:  [id number]

## 2017-02-17 NOTE — H&P (Signed)
Assisted Dr. Foster with left, ultrasound guided, popliteal, adductor canal block. Side rails up, monitors on throughout procedure. See vital signs in flow sheet. Tolerated Procedure well. 

## 2017-02-17 NOTE — Anesthesia Procedure Notes (Addendum)
Anesthesia Regional Block: Popliteal block   Pre-Anesthetic Checklist: ,, timeout performed, Correct Patient, Correct Site, Correct Laterality, Correct Procedure, Correct Position, site marked, Risks and benefits discussed,  Surgical consent,  Pre-op evaluation,  At surgeon's request and post-op pain management  Laterality: Left  Prep: chloraprep       Needles:  Injection technique: Single-shot  Needle Type: Echogenic Stimulator Needle     Needle Length: 9cm  Needle Gauge: 21     Additional Needles:   Procedures: ultrasound guided,,,,,,,,  Narrative:  Start time: 02/17/2017 7:10 AM End time: 02/17/2017 7:17 AM Injection made incrementally with aspirations every 5 mL.  Performed by: Personally  Anesthesiologist: Mal AmabileFOSTER, Shaquira Moroz  Additional Notes: Timeout performed. Patient sedated. Relevant anatomy ID'd using US. Incremental 3-575ml injection with frequent aspiration. Patient tolerated procedure well.

## 2017-02-17 NOTE — Discharge Instructions (Addendum)
Stephanie ArthursJohn Hewitt, MD [facility]  Please read the following information regarding your care after surgery.  Medications  You only need a prescription for the narcotic pain medicine (ex. oxycodone, Percocet, Norco).  All of the other medicines listed below are available over the counter. X acetominophen (Tylenol) 650 mg every 4-6 hours as you need for minor pain X oxycodone as prescribed for moderate to severe pain ?   Narcotic pain medicine (ex. oxycodone, Percocet, Vicodin) will cause constipation.  To prevent this problem, take the following medicines while you are taking any pain medicine. X docusate sodium (Colace) 100 mg twice a day X senna (Senokot) 2 tablets twice a day       Post Anesthesia Home Care Instructions  Activity: Get plenty of rest for the remainder of the day. A responsible adult should stay with you for 24 hours following the procedure.  For the next 24 hours, DO NOT: -Drive a car -Advertising copywriterperate machinery -Drink alcoholic beverages -Take any medication unless instructed by your physician -Make any legal decisions or sign important papers.  Meals: Start with liquid foods such as gelatin or soup. Progress to regular foods as tolerated. Avoid greasy, spicy, heavy foods. If nausea and/or vomiting occur, drink only clear liquids until the nausea and/or vomiting subsides. Call your physician if vomiting continues.  Special Instructions/Symptoms: Your throat may feel dry or sore from the anesthesia or the breathing tube placed in your throat during surgery. If this causes discomfort, gargle with warm salt water. The discomfort should disappear within 24 hours.  If you had a scopolamine patch placed behind your ear for the management of post- operative nausea and/or vomiting:  1. The medication in the patch is effective for 72 hours, after which it should be removed.  Wrap patch in a tissue and discard in the trash. Wash hands thoroughly with soap and water. 2.  You may remove the patch earlier than 72 hours if you experience unpleasant side effects which may include dry mouth, dizziness or visual disturbances. 3. Avoid touching the patch. Wash your hands with soap and water after contact with the patch.      Weight Bearing X Bear weight when you are able on your operated leg or foot in the flat post-op shoe.   Dressing X Keep your dressing clean and dry.  Dont put anything (coat hanger, pencil, etc) down inside of it.  If it gets damp, use a hair dryer on the cool setting to dry it.  If it gets soaked, call the office to schedule an appointment for a cast change.   After your dressing, cast or splint is removed; you may shower, but do not soak or scrub the wound.  Allow the water to run over it, and then gently pat it dry.  Swelling It is normal for you to have swelling where you had surgery.  To reduce swelling and pain, keep your toes above your nose for at least 3 days after surgery.  It may be necessary to keep your foot or leg elevated for several weeks.  If it hurts, it should be elevated.  Follow Up Call my office at [phone] when you are discharged from the hospital or surgery center to schedule an appointment to be seen two weeks after surgery.  Call my office at [phone] if you develop a fever >101.5 F, nausea, vomiting, bleeding from the surgical site or severe pain.

## 2017-02-23 DIAGNOSIS — L639 Alopecia areata, unspecified: Secondary | ICD-10-CM | POA: Diagnosis not present

## 2017-03-09 DIAGNOSIS — M1991 Primary osteoarthritis, unspecified site: Secondary | ICD-10-CM | POA: Diagnosis not present

## 2017-03-09 DIAGNOSIS — M722 Plantar fascial fibromatosis: Secondary | ICD-10-CM | POA: Diagnosis not present

## 2017-03-11 DIAGNOSIS — M1991 Primary osteoarthritis, unspecified site: Secondary | ICD-10-CM | POA: Diagnosis not present

## 2017-03-11 DIAGNOSIS — M722 Plantar fascial fibromatosis: Secondary | ICD-10-CM | POA: Diagnosis not present

## 2017-03-15 DIAGNOSIS — M1991 Primary osteoarthritis, unspecified site: Secondary | ICD-10-CM | POA: Diagnosis not present

## 2017-03-15 DIAGNOSIS — M722 Plantar fascial fibromatosis: Secondary | ICD-10-CM | POA: Diagnosis not present

## 2017-03-17 DIAGNOSIS — M722 Plantar fascial fibromatosis: Secondary | ICD-10-CM | POA: Diagnosis not present

## 2017-03-17 DIAGNOSIS — M1991 Primary osteoarthritis, unspecified site: Secondary | ICD-10-CM | POA: Diagnosis not present

## 2017-03-21 DIAGNOSIS — M1991 Primary osteoarthritis, unspecified site: Secondary | ICD-10-CM | POA: Diagnosis not present

## 2017-03-21 DIAGNOSIS — M722 Plantar fascial fibromatosis: Secondary | ICD-10-CM | POA: Diagnosis not present

## 2017-03-23 DIAGNOSIS — M1991 Primary osteoarthritis, unspecified site: Secondary | ICD-10-CM | POA: Diagnosis not present

## 2017-03-23 DIAGNOSIS — M722 Plantar fascial fibromatosis: Secondary | ICD-10-CM | POA: Diagnosis not present

## 2017-04-05 DIAGNOSIS — M722 Plantar fascial fibromatosis: Secondary | ICD-10-CM | POA: Diagnosis not present

## 2017-04-05 DIAGNOSIS — M1991 Primary osteoarthritis, unspecified site: Secondary | ICD-10-CM | POA: Diagnosis not present

## 2017-04-06 DIAGNOSIS — M2022 Hallux rigidus, left foot: Secondary | ICD-10-CM | POA: Diagnosis not present

## 2017-04-12 DIAGNOSIS — M1991 Primary osteoarthritis, unspecified site: Secondary | ICD-10-CM | POA: Diagnosis not present

## 2017-04-12 DIAGNOSIS — M722 Plantar fascial fibromatosis: Secondary | ICD-10-CM | POA: Diagnosis not present

## 2017-04-14 DIAGNOSIS — M1991 Primary osteoarthritis, unspecified site: Secondary | ICD-10-CM | POA: Diagnosis not present

## 2017-04-14 DIAGNOSIS — M722 Plantar fascial fibromatosis: Secondary | ICD-10-CM | POA: Diagnosis not present

## 2017-04-19 DIAGNOSIS — M1991 Primary osteoarthritis, unspecified site: Secondary | ICD-10-CM | POA: Diagnosis not present

## 2017-04-19 DIAGNOSIS — M722 Plantar fascial fibromatosis: Secondary | ICD-10-CM | POA: Diagnosis not present

## 2017-04-22 DIAGNOSIS — M722 Plantar fascial fibromatosis: Secondary | ICD-10-CM | POA: Diagnosis not present

## 2017-04-22 DIAGNOSIS — M1991 Primary osteoarthritis, unspecified site: Secondary | ICD-10-CM | POA: Diagnosis not present

## 2017-04-26 DIAGNOSIS — M1991 Primary osteoarthritis, unspecified site: Secondary | ICD-10-CM | POA: Diagnosis not present

## 2017-04-26 DIAGNOSIS — M722 Plantar fascial fibromatosis: Secondary | ICD-10-CM | POA: Diagnosis not present

## 2017-04-26 DIAGNOSIS — L639 Alopecia areata, unspecified: Secondary | ICD-10-CM | POA: Diagnosis not present

## 2017-04-28 DIAGNOSIS — M1991 Primary osteoarthritis, unspecified site: Secondary | ICD-10-CM | POA: Diagnosis not present

## 2017-04-28 DIAGNOSIS — M722 Plantar fascial fibromatosis: Secondary | ICD-10-CM | POA: Diagnosis not present

## 2017-07-14 DIAGNOSIS — L639 Alopecia areata, unspecified: Secondary | ICD-10-CM | POA: Diagnosis not present

## 2017-09-08 DIAGNOSIS — L639 Alopecia areata, unspecified: Secondary | ICD-10-CM | POA: Diagnosis not present

## 2017-09-23 ENCOUNTER — Other Ambulatory Visit: Payer: Self-pay | Admitting: Obstetrics and Gynecology

## 2017-09-23 DIAGNOSIS — Z1231 Encounter for screening mammogram for malignant neoplasm of breast: Secondary | ICD-10-CM

## 2017-09-29 DIAGNOSIS — H40013 Open angle with borderline findings, low risk, bilateral: Secondary | ICD-10-CM | POA: Diagnosis not present

## 2017-10-13 ENCOUNTER — Ambulatory Visit
Admission: RE | Admit: 2017-10-13 | Discharge: 2017-10-13 | Disposition: A | Discharge status: Home / Self Care | Payer: Federal, State, Local not specified - PPO | Source: Ambulatory Visit | Attending: Obstetrics and Gynecology | Admitting: Obstetrics and Gynecology

## 2017-10-13 DIAGNOSIS — Z1231 Encounter for screening mammogram for malignant neoplasm of breast: Secondary | ICD-10-CM

## 2017-10-24 DIAGNOSIS — Z6826 Body mass index (BMI) 26.0-26.9, adult: Secondary | ICD-10-CM | POA: Diagnosis not present

## 2017-10-24 DIAGNOSIS — Z01419 Encounter for gynecological examination (general) (routine) without abnormal findings: Secondary | ICD-10-CM | POA: Diagnosis not present

## 2017-10-26 DIAGNOSIS — D2230 Melanocytic nevi of unspecified part of face: Secondary | ICD-10-CM | POA: Diagnosis not present

## 2017-10-26 DIAGNOSIS — L821 Other seborrheic keratosis: Secondary | ICD-10-CM | POA: Diagnosis not present

## 2017-10-26 DIAGNOSIS — D225 Melanocytic nevi of trunk: Secondary | ICD-10-CM | POA: Diagnosis not present

## 2017-10-26 DIAGNOSIS — L639 Alopecia areata, unspecified: Secondary | ICD-10-CM | POA: Diagnosis not present

## 2017-12-02 DIAGNOSIS — R221 Localized swelling, mass and lump, neck: Secondary | ICD-10-CM | POA: Diagnosis not present

## 2017-12-02 DIAGNOSIS — F411 Generalized anxiety disorder: Secondary | ICD-10-CM | POA: Diagnosis not present

## 2017-12-02 DIAGNOSIS — L639 Alopecia areata, unspecified: Secondary | ICD-10-CM | POA: Diagnosis not present

## 2017-12-02 DIAGNOSIS — Z23 Encounter for immunization: Secondary | ICD-10-CM | POA: Diagnosis not present

## 2017-12-05 ENCOUNTER — Other Ambulatory Visit: Payer: Self-pay | Admitting: Family Medicine

## 2017-12-05 DIAGNOSIS — R221 Localized swelling, mass and lump, neck: Secondary | ICD-10-CM

## 2017-12-14 ENCOUNTER — Ambulatory Visit
Admission: RE | Admit: 2017-12-14 | Discharge: 2017-12-14 | Disposition: A | Discharge status: Home / Self Care | Payer: Federal, State, Local not specified - PPO | Source: Ambulatory Visit | Attending: Family Medicine | Admitting: Family Medicine

## 2017-12-14 DIAGNOSIS — R221 Localized swelling, mass and lump, neck: Secondary | ICD-10-CM | POA: Diagnosis not present

## 2017-12-14 DIAGNOSIS — L639 Alopecia areata, unspecified: Secondary | ICD-10-CM | POA: Diagnosis not present

## 2017-12-20 ENCOUNTER — Other Ambulatory Visit: Payer: Self-pay | Admitting: Family Medicine

## 2017-12-20 DIAGNOSIS — E041 Nontoxic single thyroid nodule: Secondary | ICD-10-CM

## 2017-12-21 ENCOUNTER — Other Ambulatory Visit: Payer: Self-pay | Admitting: Family Medicine

## 2017-12-21 DIAGNOSIS — R591 Generalized enlarged lymph nodes: Secondary | ICD-10-CM

## 2017-12-22 ENCOUNTER — Ambulatory Visit
Admission: RE | Admit: 2017-12-22 | Discharge: 2017-12-22 | Disposition: A | Discharge status: Home / Self Care | Payer: Federal, State, Local not specified - PPO | Source: Ambulatory Visit | Attending: Family Medicine | Admitting: Family Medicine

## 2017-12-22 DIAGNOSIS — E041 Nontoxic single thyroid nodule: Secondary | ICD-10-CM

## 2017-12-22 DIAGNOSIS — E042 Nontoxic multinodular goiter: Secondary | ICD-10-CM | POA: Diagnosis not present

## 2017-12-26 ENCOUNTER — Ambulatory Visit
Admission: RE | Admit: 2017-12-26 | Discharge: 2017-12-26 | Disposition: A | Discharge status: Home / Self Care | Payer: Federal, State, Local not specified - PPO | Source: Ambulatory Visit | Attending: Family Medicine | Admitting: Family Medicine

## 2017-12-26 DIAGNOSIS — R591 Generalized enlarged lymph nodes: Secondary | ICD-10-CM

## 2017-12-26 DIAGNOSIS — E042 Nontoxic multinodular goiter: Secondary | ICD-10-CM | POA: Diagnosis not present

## 2017-12-26 MED ORDER — IOPAMIDOL (ISOVUE-300) INJECTION 61%: 75.0000 mL | Freq: Once | INTRAVENOUS | Status: DC | PRN

## 2018-02-02 DIAGNOSIS — L639 Alopecia areata, unspecified: Secondary | ICD-10-CM | POA: Diagnosis not present

## 2018-02-02 DIAGNOSIS — Z79899 Other long term (current) drug therapy: Secondary | ICD-10-CM | POA: Diagnosis not present

## 2018-02-02 DIAGNOSIS — L659 Nonscarring hair loss, unspecified: Secondary | ICD-10-CM | POA: Diagnosis not present

## 2018-03-09 DIAGNOSIS — L639 Alopecia areata, unspecified: Secondary | ICD-10-CM | POA: Diagnosis not present

## 2018-03-16 DIAGNOSIS — L639 Alopecia areata, unspecified: Secondary | ICD-10-CM | POA: Diagnosis not present

## 2018-03-16 DIAGNOSIS — Z79899 Other long term (current) drug therapy: Secondary | ICD-10-CM | POA: Diagnosis not present

## 2018-03-27 DIAGNOSIS — Z79899 Other long term (current) drug therapy: Secondary | ICD-10-CM | POA: Diagnosis not present

## 2018-03-27 DIAGNOSIS — L639 Alopecia areata, unspecified: Secondary | ICD-10-CM | POA: Diagnosis not present

## 2018-05-11 DIAGNOSIS — L639 Alopecia areata, unspecified: Secondary | ICD-10-CM | POA: Diagnosis not present

## 2018-05-11 DIAGNOSIS — Z79899 Other long term (current) drug therapy: Secondary | ICD-10-CM | POA: Diagnosis not present

## 2018-07-25 DIAGNOSIS — L639 Alopecia areata, unspecified: Secondary | ICD-10-CM | POA: Diagnosis not present

## 2018-07-25 DIAGNOSIS — Z79899 Other long term (current) drug therapy: Secondary | ICD-10-CM | POA: Diagnosis not present

## 2018-10-02 DIAGNOSIS — R9389 Abnormal findings on diagnostic imaging of other specified body structures: Secondary | ICD-10-CM | POA: Diagnosis not present

## 2018-10-02 DIAGNOSIS — L659 Nonscarring hair loss, unspecified: Secondary | ICD-10-CM | POA: Diagnosis not present

## 2018-10-02 DIAGNOSIS — R52 Pain, unspecified: Secondary | ICD-10-CM | POA: Diagnosis not present

## 2018-10-02 DIAGNOSIS — E042 Nontoxic multinodular goiter: Secondary | ICD-10-CM | POA: Diagnosis not present

## 2018-10-02 DIAGNOSIS — L639 Alopecia areata, unspecified: Secondary | ICD-10-CM | POA: Diagnosis not present

## 2018-10-02 DIAGNOSIS — R197 Diarrhea, unspecified: Secondary | ICD-10-CM | POA: Diagnosis not present

## 2018-10-02 DIAGNOSIS — M1990 Unspecified osteoarthritis, unspecified site: Secondary | ICD-10-CM | POA: Diagnosis not present

## 2018-10-02 DIAGNOSIS — Z23 Encounter for immunization: Secondary | ICD-10-CM | POA: Diagnosis not present

## 2018-10-04 ENCOUNTER — Other Ambulatory Visit: Payer: Self-pay | Admitting: Obstetrics and Gynecology

## 2018-10-04 DIAGNOSIS — Z1231 Encounter for screening mammogram for malignant neoplasm of breast: Secondary | ICD-10-CM

## 2018-11-07 DIAGNOSIS — D225 Melanocytic nevi of trunk: Secondary | ICD-10-CM | POA: Diagnosis not present

## 2018-11-07 DIAGNOSIS — L639 Alopecia areata, unspecified: Secondary | ICD-10-CM | POA: Diagnosis not present

## 2018-11-07 DIAGNOSIS — D2230 Melanocytic nevi of unspecified part of face: Secondary | ICD-10-CM | POA: Diagnosis not present

## 2018-11-07 DIAGNOSIS — D2271 Melanocytic nevi of right lower limb, including hip: Secondary | ICD-10-CM | POA: Diagnosis not present

## 2018-11-14 ENCOUNTER — Other Ambulatory Visit: Payer: Self-pay | Admitting: Internal Medicine

## 2018-11-14 DIAGNOSIS — E042 Nontoxic multinodular goiter: Secondary | ICD-10-CM | POA: Diagnosis not present

## 2018-11-14 DIAGNOSIS — R52 Pain, unspecified: Secondary | ICD-10-CM | POA: Diagnosis not present

## 2018-11-14 DIAGNOSIS — M1990 Unspecified osteoarthritis, unspecified site: Secondary | ICD-10-CM | POA: Diagnosis not present

## 2018-11-14 DIAGNOSIS — L639 Alopecia areata, unspecified: Secondary | ICD-10-CM | POA: Diagnosis not present

## 2018-11-16 ENCOUNTER — Ambulatory Visit
Admission: RE | Admit: 2018-11-16 | Discharge: 2018-11-16 | Disposition: A | Discharge status: Home / Self Care | Payer: Federal, State, Local not specified - PPO | Source: Ambulatory Visit | Attending: Obstetrics and Gynecology | Admitting: Obstetrics and Gynecology

## 2018-11-16 DIAGNOSIS — Z1231 Encounter for screening mammogram for malignant neoplasm of breast: Secondary | ICD-10-CM | POA: Diagnosis not present

## 2018-12-19 ENCOUNTER — Ambulatory Visit
Admission: RE | Admit: 2018-12-19 | Discharge: 2018-12-19 | Disposition: A | Discharge status: Home / Self Care | Payer: Federal, State, Local not specified - PPO | Source: Ambulatory Visit | Attending: Internal Medicine | Admitting: Internal Medicine

## 2018-12-19 DIAGNOSIS — E041 Nontoxic single thyroid nodule: Secondary | ICD-10-CM | POA: Diagnosis not present

## 2018-12-19 DIAGNOSIS — E042 Nontoxic multinodular goiter: Secondary | ICD-10-CM

## 2018-12-21 DIAGNOSIS — L719 Rosacea, unspecified: Secondary | ICD-10-CM | POA: Diagnosis not present

## 2018-12-21 DIAGNOSIS — Z79899 Other long term (current) drug therapy: Secondary | ICD-10-CM | POA: Diagnosis not present

## 2018-12-21 DIAGNOSIS — L639 Alopecia areata, unspecified: Secondary | ICD-10-CM | POA: Diagnosis not present

## 2019-01-08 ENCOUNTER — Other Ambulatory Visit: Payer: Self-pay | Admitting: Internal Medicine

## 2019-01-08 DIAGNOSIS — E042 Nontoxic multinodular goiter: Secondary | ICD-10-CM

## 2019-01-16 ENCOUNTER — Other Ambulatory Visit (HOSPITAL_COMMUNITY)
Admission: RE | Admit: 2019-01-16 | Discharge: 2019-01-16 | Disposition: A | Discharge status: Home / Self Care | Payer: Federal, State, Local not specified - PPO | Source: Ambulatory Visit | Attending: Student | Admitting: Student

## 2019-01-16 ENCOUNTER — Ambulatory Visit
Admission: RE | Admit: 2019-01-16 | Discharge: 2019-01-16 | Disposition: A | Discharge status: Home / Self Care | Payer: Federal, State, Local not specified - PPO | Source: Ambulatory Visit | Attending: Internal Medicine | Admitting: Internal Medicine

## 2019-01-16 DIAGNOSIS — E042 Nontoxic multinodular goiter: Secondary | ICD-10-CM

## 2019-01-16 DIAGNOSIS — E041 Nontoxic single thyroid nodule: Secondary | ICD-10-CM | POA: Diagnosis not present

## 2019-01-22 DIAGNOSIS — L639 Alopecia areata, unspecified: Secondary | ICD-10-CM | POA: Diagnosis not present

## 2019-01-22 DIAGNOSIS — Z79899 Other long term (current) drug therapy: Secondary | ICD-10-CM | POA: Diagnosis not present

## 2019-01-29 DIAGNOSIS — Z1151 Encounter for screening for human papillomavirus (HPV): Secondary | ICD-10-CM | POA: Diagnosis not present

## 2019-01-29 DIAGNOSIS — Z6828 Body mass index (BMI) 28.0-28.9, adult: Secondary | ICD-10-CM | POA: Diagnosis not present

## 2019-01-29 DIAGNOSIS — Z01419 Encounter for gynecological examination (general) (routine) without abnormal findings: Secondary | ICD-10-CM | POA: Diagnosis not present

## 2019-01-30 DIAGNOSIS — L639 Alopecia areata, unspecified: Secondary | ICD-10-CM | POA: Diagnosis not present

## 2019-02-14 DIAGNOSIS — R87612 Low grade squamous intraepithelial lesion on cytologic smear of cervix (LGSIL): Secondary | ICD-10-CM | POA: Diagnosis not present

## 2019-02-20 DIAGNOSIS — L639 Alopecia areata, unspecified: Secondary | ICD-10-CM | POA: Diagnosis not present

## 2019-02-20 DIAGNOSIS — Z23 Encounter for immunization: Secondary | ICD-10-CM | POA: Diagnosis not present

## 2019-04-25 DIAGNOSIS — L639 Alopecia areata, unspecified: Secondary | ICD-10-CM | POA: Diagnosis not present

## 2019-04-25 DIAGNOSIS — Z79899 Other long term (current) drug therapy: Secondary | ICD-10-CM | POA: Diagnosis not present

## 2019-08-01 DIAGNOSIS — Z79899 Other long term (current) drug therapy: Secondary | ICD-10-CM | POA: Diagnosis not present

## 2019-08-01 DIAGNOSIS — L639 Alopecia areata, unspecified: Secondary | ICD-10-CM | POA: Diagnosis not present

## 2019-09-11 DIAGNOSIS — Z23 Encounter for immunization: Secondary | ICD-10-CM | POA: Diagnosis not present

## 2019-10-02 DIAGNOSIS — L639 Alopecia areata, unspecified: Secondary | ICD-10-CM | POA: Diagnosis not present

## 2019-10-02 DIAGNOSIS — Z79899 Other long term (current) drug therapy: Secondary | ICD-10-CM | POA: Diagnosis not present

## 2019-10-24 ENCOUNTER — Other Ambulatory Visit: Payer: Self-pay

## 2019-10-24 DIAGNOSIS — Z20822 Contact with and (suspected) exposure to covid-19: Secondary | ICD-10-CM

## 2019-10-25 LAB — NOVEL CORONAVIRUS, NAA: SARS-CoV-2, NAA: NOT DETECTED

## 2019-11-01 ENCOUNTER — Other Ambulatory Visit: Payer: Self-pay | Admitting: Obstetrics and Gynecology

## 2019-11-01 DIAGNOSIS — Z1231 Encounter for screening mammogram for malignant neoplasm of breast: Secondary | ICD-10-CM

## 2019-11-14 DIAGNOSIS — Z20828 Contact with and (suspected) exposure to other viral communicable diseases: Secondary | ICD-10-CM | POA: Diagnosis not present

## 2019-11-20 DIAGNOSIS — E042 Nontoxic multinodular goiter: Secondary | ICD-10-CM | POA: Diagnosis not present

## 2019-11-28 DIAGNOSIS — L821 Other seborrheic keratosis: Secondary | ICD-10-CM | POA: Diagnosis not present

## 2019-11-28 DIAGNOSIS — D224 Melanocytic nevi of scalp and neck: Secondary | ICD-10-CM | POA: Diagnosis not present

## 2019-11-28 DIAGNOSIS — L639 Alopecia areata, unspecified: Secondary | ICD-10-CM | POA: Diagnosis not present

## 2019-11-28 DIAGNOSIS — D225 Melanocytic nevi of trunk: Secondary | ICD-10-CM | POA: Diagnosis not present

## 2019-12-27 ENCOUNTER — Ambulatory Visit
Admission: RE | Admit: 2019-12-27 | Discharge: 2019-12-27 | Disposition: A | Discharge status: Home / Self Care | Payer: Federal, State, Local not specified - PPO | Source: Ambulatory Visit | Attending: Obstetrics and Gynecology | Admitting: Obstetrics and Gynecology

## 2019-12-27 ENCOUNTER — Other Ambulatory Visit: Payer: Self-pay

## 2019-12-27 DIAGNOSIS — Z1231 Encounter for screening mammogram for malignant neoplasm of breast: Secondary | ICD-10-CM

## 2020-01-11 DIAGNOSIS — L639 Alopecia areata, unspecified: Secondary | ICD-10-CM | POA: Diagnosis not present

## 2020-01-30 DIAGNOSIS — L820 Inflamed seborrheic keratosis: Secondary | ICD-10-CM | POA: Diagnosis not present

## 2020-02-22 DIAGNOSIS — L639 Alopecia areata, unspecified: Secondary | ICD-10-CM | POA: Diagnosis not present

## 2020-03-31 DIAGNOSIS — S93602A Unspecified sprain of left foot, initial encounter: Secondary | ICD-10-CM | POA: Diagnosis not present

## 2020-04-02 DIAGNOSIS — Z6826 Body mass index (BMI) 26.0-26.9, adult: Secondary | ICD-10-CM | POA: Diagnosis not present

## 2020-04-02 DIAGNOSIS — Z01419 Encounter for gynecological examination (general) (routine) without abnormal findings: Secondary | ICD-10-CM | POA: Diagnosis not present

## 2020-04-02 DIAGNOSIS — R87612 Low grade squamous intraepithelial lesion on cytologic smear of cervix (LGSIL): Secondary | ICD-10-CM | POA: Diagnosis not present

## 2020-05-05 DIAGNOSIS — H40013 Open angle with borderline findings, low risk, bilateral: Secondary | ICD-10-CM | POA: Diagnosis not present

## 2020-06-05 DIAGNOSIS — M25571 Pain in right ankle and joints of right foot: Secondary | ICD-10-CM | POA: Diagnosis not present

## 2020-06-05 DIAGNOSIS — M67961 Unspecified disorder of synovium and tendon, right lower leg: Secondary | ICD-10-CM | POA: Diagnosis not present

## 2020-06-05 DIAGNOSIS — M2022 Hallux rigidus, left foot: Secondary | ICD-10-CM | POA: Diagnosis not present

## 2020-06-05 DIAGNOSIS — M79672 Pain in left foot: Secondary | ICD-10-CM | POA: Diagnosis not present

## 2020-06-05 DIAGNOSIS — M79671 Pain in right foot: Secondary | ICD-10-CM | POA: Diagnosis not present

## 2020-06-09 DIAGNOSIS — M25571 Pain in right ankle and joints of right foot: Secondary | ICD-10-CM | POA: Diagnosis not present

## 2020-06-09 DIAGNOSIS — M76821 Posterior tibial tendinitis, right leg: Secondary | ICD-10-CM | POA: Diagnosis not present

## 2020-06-17 DIAGNOSIS — M25571 Pain in right ankle and joints of right foot: Secondary | ICD-10-CM | POA: Diagnosis not present

## 2020-06-17 DIAGNOSIS — L639 Alopecia areata, unspecified: Secondary | ICD-10-CM | POA: Diagnosis not present

## 2020-06-17 DIAGNOSIS — M76821 Posterior tibial tendinitis, right leg: Secondary | ICD-10-CM | POA: Diagnosis not present

## 2020-06-17 DIAGNOSIS — L648 Other androgenic alopecia: Secondary | ICD-10-CM | POA: Diagnosis not present

## 2020-06-17 DIAGNOSIS — L650 Telogen effluvium: Secondary | ICD-10-CM | POA: Diagnosis not present

## 2020-06-20 DIAGNOSIS — M76821 Posterior tibial tendinitis, right leg: Secondary | ICD-10-CM | POA: Diagnosis not present

## 2020-06-20 DIAGNOSIS — M25571 Pain in right ankle and joints of right foot: Secondary | ICD-10-CM | POA: Diagnosis not present

## 2020-06-24 DIAGNOSIS — M25571 Pain in right ankle and joints of right foot: Secondary | ICD-10-CM | POA: Diagnosis not present

## 2020-06-24 DIAGNOSIS — M76821 Posterior tibial tendinitis, right leg: Secondary | ICD-10-CM | POA: Diagnosis not present

## 2020-06-27 DIAGNOSIS — M76821 Posterior tibial tendinitis, right leg: Secondary | ICD-10-CM | POA: Diagnosis not present

## 2020-06-27 DIAGNOSIS — M25571 Pain in right ankle and joints of right foot: Secondary | ICD-10-CM | POA: Diagnosis not present

## 2020-07-01 DIAGNOSIS — L650 Telogen effluvium: Secondary | ICD-10-CM | POA: Diagnosis not present

## 2020-07-01 DIAGNOSIS — M25571 Pain in right ankle and joints of right foot: Secondary | ICD-10-CM | POA: Diagnosis not present

## 2020-07-01 DIAGNOSIS — M76821 Posterior tibial tendinitis, right leg: Secondary | ICD-10-CM | POA: Diagnosis not present

## 2020-07-04 DIAGNOSIS — M76821 Posterior tibial tendinitis, right leg: Secondary | ICD-10-CM | POA: Diagnosis not present

## 2020-07-04 DIAGNOSIS — M25571 Pain in right ankle and joints of right foot: Secondary | ICD-10-CM | POA: Diagnosis not present

## 2020-07-08 DIAGNOSIS — M25571 Pain in right ankle and joints of right foot: Secondary | ICD-10-CM | POA: Diagnosis not present

## 2020-07-08 DIAGNOSIS — M76821 Posterior tibial tendinitis, right leg: Secondary | ICD-10-CM | POA: Diagnosis not present

## 2020-07-11 DIAGNOSIS — M25571 Pain in right ankle and joints of right foot: Secondary | ICD-10-CM | POA: Diagnosis not present

## 2020-07-11 DIAGNOSIS — M76821 Posterior tibial tendinitis, right leg: Secondary | ICD-10-CM | POA: Diagnosis not present

## 2020-07-15 DIAGNOSIS — M25571 Pain in right ankle and joints of right foot: Secondary | ICD-10-CM | POA: Diagnosis not present

## 2020-07-15 DIAGNOSIS — M76821 Posterior tibial tendinitis, right leg: Secondary | ICD-10-CM | POA: Diagnosis not present

## 2020-07-18 DIAGNOSIS — M67961 Unspecified disorder of synovium and tendon, right lower leg: Secondary | ICD-10-CM | POA: Diagnosis not present

## 2020-07-30 DIAGNOSIS — Z23 Encounter for immunization: Secondary | ICD-10-CM | POA: Diagnosis not present

## 2020-09-09 DIAGNOSIS — N950 Postmenopausal bleeding: Secondary | ICD-10-CM | POA: Diagnosis not present

## 2020-09-09 DIAGNOSIS — N393 Stress incontinence (female) (male): Secondary | ICD-10-CM | POA: Diagnosis not present

## 2020-09-30 DIAGNOSIS — N950 Postmenopausal bleeding: Secondary | ICD-10-CM | POA: Diagnosis not present

## 2020-10-07 DIAGNOSIS — M150 Primary generalized (osteo)arthritis: Secondary | ICD-10-CM | POA: Diagnosis not present

## 2020-10-07 DIAGNOSIS — L639 Alopecia areata, unspecified: Secondary | ICD-10-CM | POA: Diagnosis not present

## 2020-10-07 DIAGNOSIS — M25571 Pain in right ankle and joints of right foot: Secondary | ICD-10-CM | POA: Diagnosis not present

## 2020-10-07 DIAGNOSIS — M2550 Pain in unspecified joint: Secondary | ICD-10-CM | POA: Diagnosis not present

## 2020-10-29 DIAGNOSIS — L639 Alopecia areata, unspecified: Secondary | ICD-10-CM | POA: Diagnosis not present

## 2020-10-29 DIAGNOSIS — M150 Primary generalized (osteo)arthritis: Secondary | ICD-10-CM | POA: Diagnosis not present

## 2020-10-29 DIAGNOSIS — M2550 Pain in unspecified joint: Secondary | ICD-10-CM | POA: Diagnosis not present

## 2020-12-15 DIAGNOSIS — J069 Acute upper respiratory infection, unspecified: Secondary | ICD-10-CM | POA: Diagnosis not present

## 2020-12-15 DIAGNOSIS — U071 COVID-19: Secondary | ICD-10-CM | POA: Diagnosis not present

## 2020-12-23 DIAGNOSIS — L821 Other seborrheic keratosis: Secondary | ICD-10-CM | POA: Diagnosis not present

## 2020-12-23 DIAGNOSIS — D225 Melanocytic nevi of trunk: Secondary | ICD-10-CM | POA: Diagnosis not present

## 2020-12-23 DIAGNOSIS — L570 Actinic keratosis: Secondary | ICD-10-CM | POA: Diagnosis not present

## 2020-12-23 DIAGNOSIS — L578 Other skin changes due to chronic exposure to nonionizing radiation: Secondary | ICD-10-CM | POA: Diagnosis not present

## 2020-12-23 DIAGNOSIS — L639 Alopecia areata, unspecified: Secondary | ICD-10-CM | POA: Diagnosis not present

## 2021-01-06 ENCOUNTER — Other Ambulatory Visit: Payer: Self-pay | Admitting: Obstetrics and Gynecology

## 2021-01-06 DIAGNOSIS — Z1231 Encounter for screening mammogram for malignant neoplasm of breast: Secondary | ICD-10-CM

## 2021-02-06 DIAGNOSIS — L570 Actinic keratosis: Secondary | ICD-10-CM | POA: Diagnosis not present

## 2021-02-18 ENCOUNTER — Other Ambulatory Visit: Payer: Self-pay

## 2021-02-18 ENCOUNTER — Ambulatory Visit
Admission: RE | Admit: 2021-02-18 | Discharge: 2021-02-18 | Disposition: A | Discharge status: Home / Self Care | Payer: Federal, State, Local not specified - PPO | Source: Ambulatory Visit | Attending: Obstetrics and Gynecology | Admitting: Obstetrics and Gynecology

## 2021-02-18 DIAGNOSIS — Z1231 Encounter for screening mammogram for malignant neoplasm of breast: Secondary | ICD-10-CM | POA: Diagnosis not present

## 2021-02-26 DIAGNOSIS — R1032 Left lower quadrant pain: Secondary | ICD-10-CM | POA: Diagnosis not present

## 2021-02-26 DIAGNOSIS — N959 Unspecified menopausal and perimenopausal disorder: Secondary | ICD-10-CM | POA: Diagnosis not present

## 2021-02-26 DIAGNOSIS — N950 Postmenopausal bleeding: Secondary | ICD-10-CM | POA: Diagnosis not present

## 2021-03-19 DIAGNOSIS — N393 Stress incontinence (female) (male): Secondary | ICD-10-CM | POA: Diagnosis not present

## 2021-04-28 DIAGNOSIS — M2550 Pain in unspecified joint: Secondary | ICD-10-CM | POA: Diagnosis not present

## 2021-04-28 DIAGNOSIS — L639 Alopecia areata, unspecified: Secondary | ICD-10-CM | POA: Diagnosis not present

## 2021-04-28 DIAGNOSIS — M150 Primary generalized (osteo)arthritis: Secondary | ICD-10-CM | POA: Diagnosis not present

## 2021-05-27 DIAGNOSIS — Z20822 Contact with and (suspected) exposure to covid-19: Secondary | ICD-10-CM | POA: Diagnosis not present

## 2021-06-11 DIAGNOSIS — N393 Stress incontinence (female) (male): Secondary | ICD-10-CM | POA: Diagnosis not present

## 2021-07-13 IMAGING — MG DIGITAL SCREENING BILAT W/ TOMO W/ CAD
6 of 12 series · 6 of 36 positions shown · non-contrast
Comparison: Previous exam(s).

CLINICAL DATA: Screening.

EXAM:
DIGITAL SCREENING BILATERAL MAMMOGRAM WITH TOMO AND CAD

[L CC synth-2D (1 of 2)]
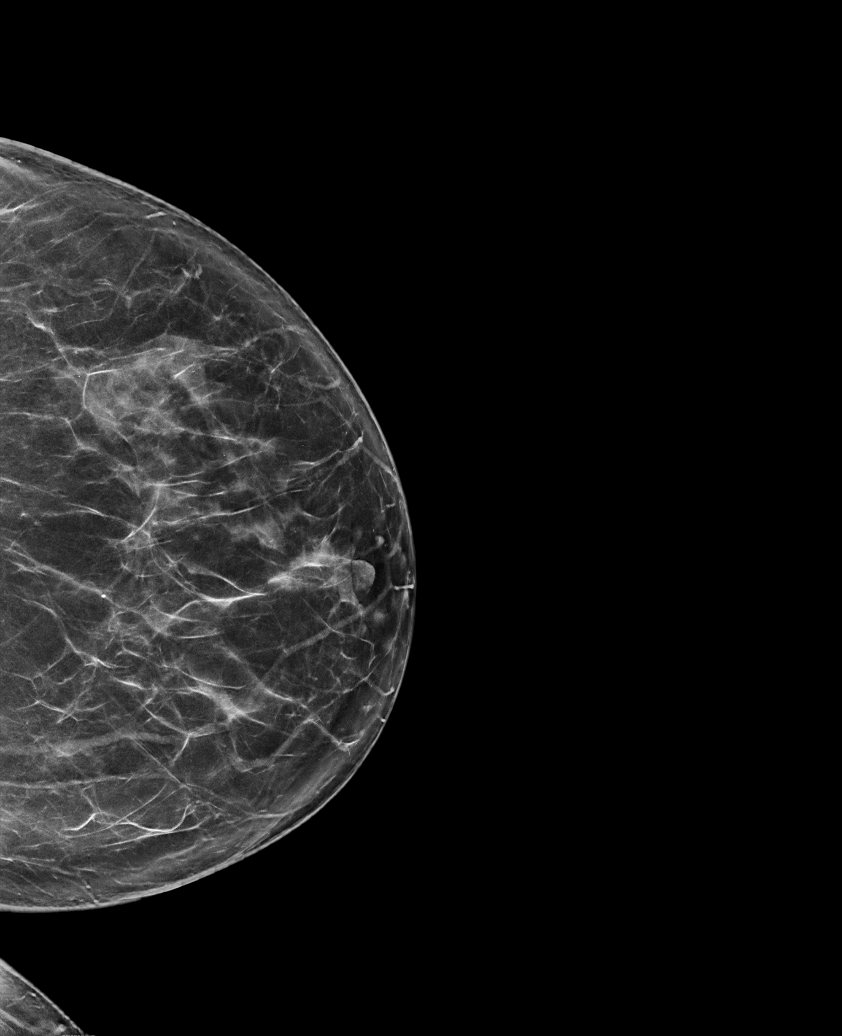

[R CC synth-2D (1 of 2)]
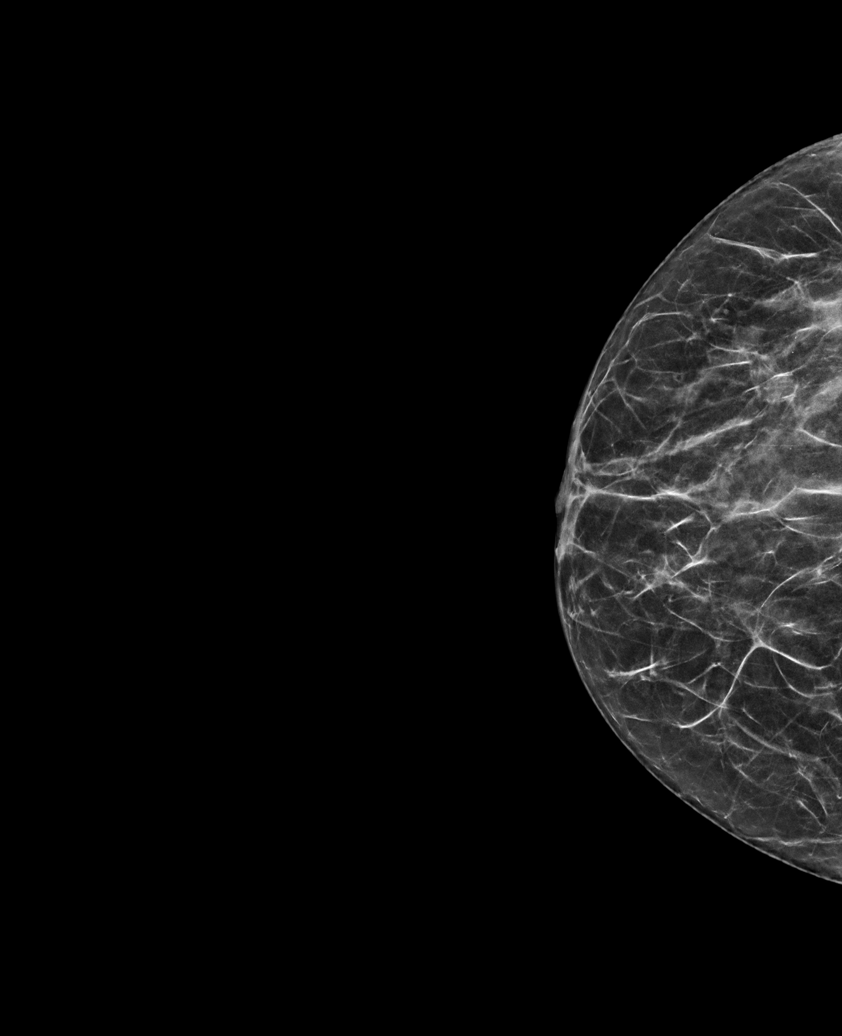

[L CC synth-2D (2 of 2)]
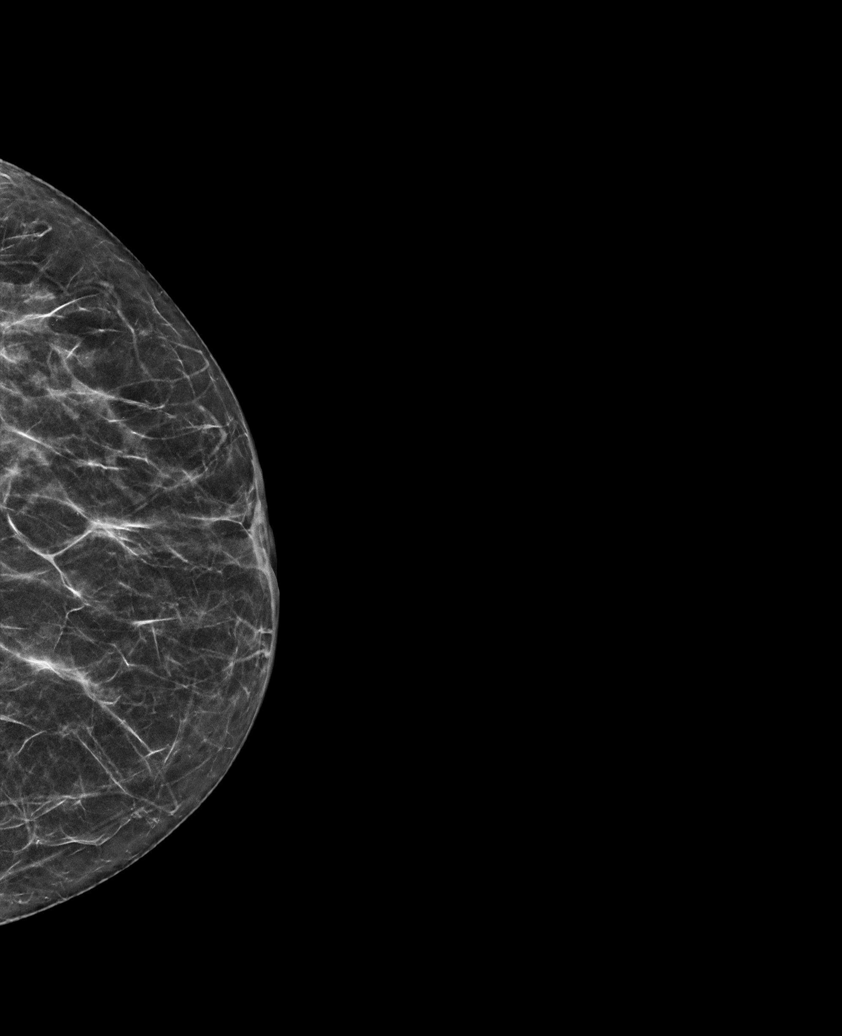

[L MLO synth-2D]
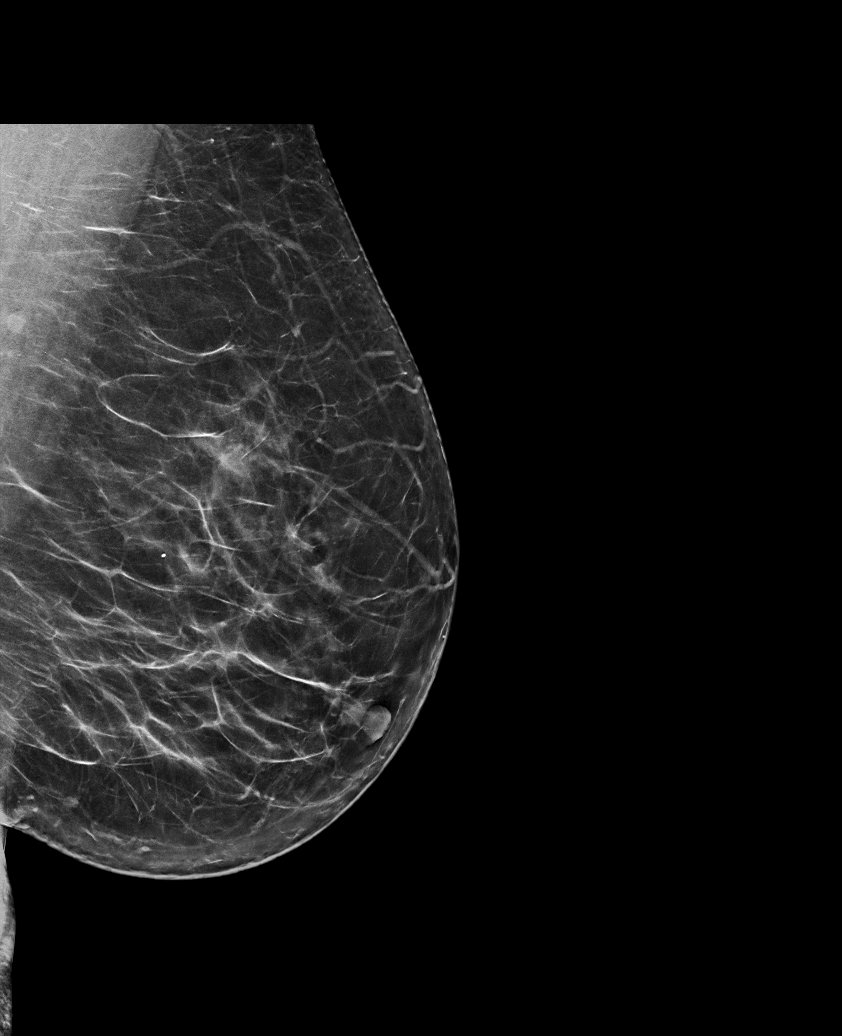

[R CC synth-2D (2 of 2)]
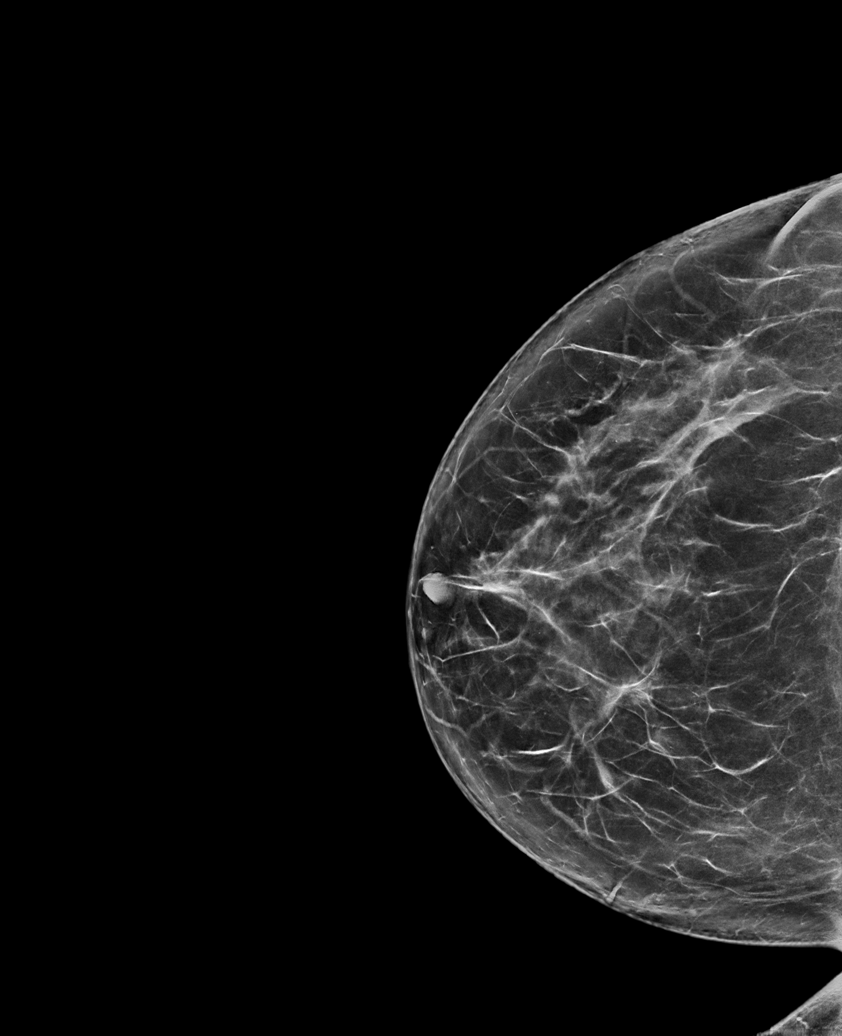

[R MLO synth-2D]
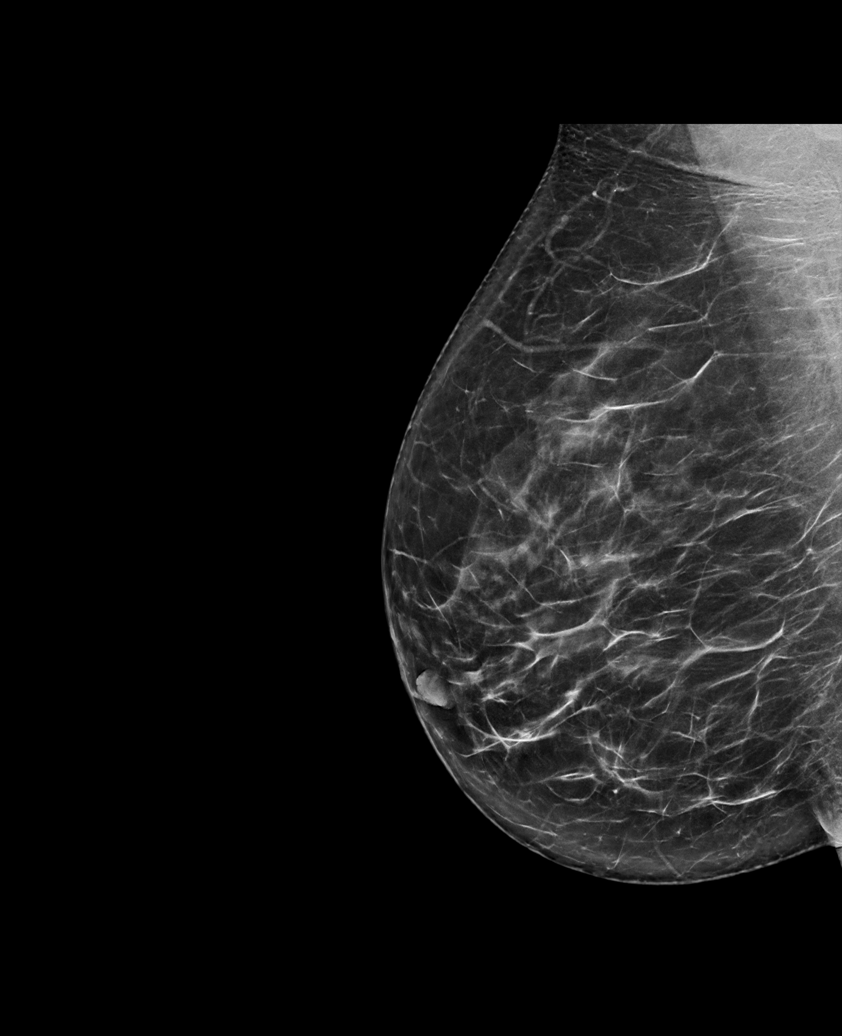

[6 of 36 positions shown; findings below may reference images not displayed]

ACR Breast Density Category b: There are scattered areas of
fibroglandular density.
FINDINGS: There are no findings suspicious for malignancy. Images were
processed with CAD.
IMPRESSION: No mammographic evidence of malignancy. A result letter of this
screening mammogram will be mailed directly to the patient.

RECOMMENDATION:
Screening mammogram in one year. (Code:[id number])

BI-RADS CATEGORY  1: Negative.

## 2021-07-23 DIAGNOSIS — N393 Stress incontinence (female) (male): Secondary | ICD-10-CM | POA: Diagnosis not present

## 2021-07-23 DIAGNOSIS — L219 Seborrheic dermatitis, unspecified: Secondary | ICD-10-CM | POA: Diagnosis not present

## 2021-08-11 DIAGNOSIS — Z124 Encounter for screening for malignant neoplasm of cervix: Secondary | ICD-10-CM | POA: Diagnosis not present

## 2021-08-11 DIAGNOSIS — Z01411 Encounter for gynecological examination (general) (routine) with abnormal findings: Secondary | ICD-10-CM | POA: Diagnosis not present

## 2021-08-11 DIAGNOSIS — Z6827 Body mass index (BMI) 27.0-27.9, adult: Secondary | ICD-10-CM | POA: Diagnosis not present

## 2021-08-11 DIAGNOSIS — Z01419 Encounter for gynecological examination (general) (routine) without abnormal findings: Secondary | ICD-10-CM | POA: Diagnosis not present

## 2021-08-11 DIAGNOSIS — Z113 Encounter for screening for infections with a predominantly sexual mode of transmission: Secondary | ICD-10-CM | POA: Diagnosis not present

## 2021-08-31 ENCOUNTER — Other Ambulatory Visit (HOSPITAL_BASED_OUTPATIENT_CLINIC_OR_DEPARTMENT_OTHER): Payer: Self-pay | Admitting: Family Medicine

## 2021-08-31 DIAGNOSIS — R600 Localized edema: Secondary | ICD-10-CM

## 2021-08-31 DIAGNOSIS — Z8249 Family history of ischemic heart disease and other diseases of the circulatory system: Secondary | ICD-10-CM

## 2021-08-31 DIAGNOSIS — L639 Alopecia areata, unspecified: Secondary | ICD-10-CM | POA: Diagnosis not present

## 2021-09-01 ENCOUNTER — Other Ambulatory Visit: Payer: Self-pay

## 2021-09-01 ENCOUNTER — Ambulatory Visit (HOSPITAL_BASED_OUTPATIENT_CLINIC_OR_DEPARTMENT_OTHER)
Admission: RE | Admit: 2021-09-01 | Discharge: 2021-09-01 | Disposition: A | Discharge status: Home / Self Care | Payer: Federal, State, Local not specified - PPO | Source: Ambulatory Visit | Attending: Family Medicine | Admitting: Family Medicine

## 2021-09-01 DIAGNOSIS — R600 Localized edema: Secondary | ICD-10-CM | POA: Insufficient documentation

## 2021-09-01 DIAGNOSIS — Z8249 Family history of ischemic heart disease and other diseases of the circulatory system: Secondary | ICD-10-CM | POA: Insufficient documentation

## 2021-09-12 DIAGNOSIS — R0981 Nasal congestion: Secondary | ICD-10-CM | POA: Diagnosis not present

## 2021-09-16 ENCOUNTER — Telehealth: Payer: Self-pay | Admitting: Genetic Counselor

## 2021-09-16 NOTE — Telephone Encounter (Signed)
Scheduled appt per 10/4 referral. Pt is aware of appt date and time.  

## 2021-09-22 DIAGNOSIS — J019 Acute sinusitis, unspecified: Secondary | ICD-10-CM | POA: Diagnosis not present

## 2021-09-29 ENCOUNTER — Encounter: Payer: Federal, State, Local not specified - PPO | Admitting: Genetic Counselor

## 2021-09-29 ENCOUNTER — Other Ambulatory Visit: Payer: Federal, State, Local not specified - PPO

## 2021-10-23 DIAGNOSIS — L0100 Impetigo, unspecified: Secondary | ICD-10-CM | POA: Diagnosis not present

## 2021-10-26 DIAGNOSIS — Z1589 Genetic susceptibility to other disease: Secondary | ICD-10-CM | POA: Diagnosis not present

## 2021-10-26 DIAGNOSIS — Z8249 Family history of ischemic heart disease and other diseases of the circulatory system: Secondary | ICD-10-CM | POA: Diagnosis not present

## 2021-11-18 DIAGNOSIS — L309 Dermatitis, unspecified: Secondary | ICD-10-CM | POA: Diagnosis not present

## 2021-11-18 DIAGNOSIS — L245 Irritant contact dermatitis due to other chemical products: Secondary | ICD-10-CM | POA: Diagnosis not present

## 2021-11-18 DIAGNOSIS — Z23 Encounter for immunization: Secondary | ICD-10-CM | POA: Diagnosis not present

## 2021-11-19 DIAGNOSIS — E042 Nontoxic multinodular goiter: Secondary | ICD-10-CM | POA: Diagnosis not present

## 2021-12-15 DIAGNOSIS — L639 Alopecia areata, unspecified: Secondary | ICD-10-CM | POA: Diagnosis not present

## 2021-12-15 DIAGNOSIS — Z79899 Other long term (current) drug therapy: Secondary | ICD-10-CM | POA: Diagnosis not present

## 2021-12-15 DIAGNOSIS — M2550 Pain in unspecified joint: Secondary | ICD-10-CM | POA: Diagnosis not present

## 2021-12-15 DIAGNOSIS — M150 Primary generalized (osteo)arthritis: Secondary | ICD-10-CM | POA: Diagnosis not present

## 2021-12-23 DIAGNOSIS — E041 Nontoxic single thyroid nodule: Secondary | ICD-10-CM | POA: Diagnosis not present

## 2021-12-23 DIAGNOSIS — E042 Nontoxic multinodular goiter: Secondary | ICD-10-CM | POA: Diagnosis not present

## 2021-12-31 DIAGNOSIS — L719 Rosacea, unspecified: Secondary | ICD-10-CM | POA: Diagnosis not present

## 2021-12-31 DIAGNOSIS — D224 Melanocytic nevi of scalp and neck: Secondary | ICD-10-CM | POA: Diagnosis not present

## 2021-12-31 DIAGNOSIS — L821 Other seborrheic keratosis: Secondary | ICD-10-CM | POA: Diagnosis not present

## 2021-12-31 DIAGNOSIS — L578 Other skin changes due to chronic exposure to nonionizing radiation: Secondary | ICD-10-CM | POA: Diagnosis not present

## 2022-01-12 DIAGNOSIS — R202 Paresthesia of skin: Secondary | ICD-10-CM | POA: Diagnosis not present

## 2022-01-12 DIAGNOSIS — M5416 Radiculopathy, lumbar region: Secondary | ICD-10-CM | POA: Diagnosis not present

## 2022-01-15 DIAGNOSIS — R7989 Other specified abnormal findings of blood chemistry: Secondary | ICD-10-CM | POA: Diagnosis not present

## 2022-01-27 DIAGNOSIS — E042 Nontoxic multinodular goiter: Secondary | ICD-10-CM | POA: Diagnosis not present

## 2022-01-29 DIAGNOSIS — N393 Stress incontinence (female) (male): Secondary | ICD-10-CM | POA: Diagnosis not present

## 2022-02-02 DIAGNOSIS — H40013 Open angle with borderline findings, low risk, bilateral: Secondary | ICD-10-CM | POA: Diagnosis not present

## 2022-02-18 ENCOUNTER — Other Ambulatory Visit: Payer: Self-pay | Admitting: Obstetrics and Gynecology

## 2022-02-18 DIAGNOSIS — Z1231 Encounter for screening mammogram for malignant neoplasm of breast: Secondary | ICD-10-CM

## 2022-03-02 ENCOUNTER — Ambulatory Visit
Admission: RE | Admit: 2022-03-02 | Discharge: 2022-03-02 | Disposition: A | Discharge status: Home / Self Care | Payer: Federal, State, Local not specified - PPO | Source: Ambulatory Visit | Attending: Obstetrics and Gynecology | Admitting: Obstetrics and Gynecology

## 2022-03-02 DIAGNOSIS — Z1231 Encounter for screening mammogram for malignant neoplasm of breast: Secondary | ICD-10-CM | POA: Diagnosis not present

## 2022-03-15 DIAGNOSIS — Z131 Encounter for screening for diabetes mellitus: Secondary | ICD-10-CM | POA: Diagnosis not present

## 2022-03-15 DIAGNOSIS — Z13220 Encounter for screening for lipoid disorders: Secondary | ICD-10-CM | POA: Diagnosis not present

## 2022-03-15 DIAGNOSIS — Z0000 Encounter for general adult medical examination without abnormal findings: Secondary | ICD-10-CM | POA: Diagnosis not present

## 2022-03-15 DIAGNOSIS — Z1159 Encounter for screening for other viral diseases: Secondary | ICD-10-CM | POA: Diagnosis not present

## 2022-03-16 ENCOUNTER — Other Ambulatory Visit: Payer: Self-pay | Admitting: Family Medicine

## 2022-03-16 DIAGNOSIS — Z8249 Family history of ischemic heart disease and other diseases of the circulatory system: Secondary | ICD-10-CM

## 2022-03-30 DIAGNOSIS — R7401 Elevation of levels of liver transaminase levels: Secondary | ICD-10-CM | POA: Diagnosis not present

## 2022-04-12 DIAGNOSIS — F411 Generalized anxiety disorder: Secondary | ICD-10-CM | POA: Diagnosis not present

## 2022-04-12 DIAGNOSIS — E663 Overweight: Secondary | ICD-10-CM | POA: Diagnosis not present

## 2022-04-12 DIAGNOSIS — L639 Alopecia areata, unspecified: Secondary | ICD-10-CM | POA: Diagnosis not present

## 2022-04-12 DIAGNOSIS — R748 Abnormal levels of other serum enzymes: Secondary | ICD-10-CM | POA: Diagnosis not present

## 2022-04-13 ENCOUNTER — Ambulatory Visit
Admission: RE | Admit: 2022-04-13 | Discharge: 2022-04-13 | Disposition: A | Discharge status: Home / Self Care | Payer: No Typology Code available for payment source | Source: Ambulatory Visit | Attending: Family Medicine | Admitting: Family Medicine

## 2022-04-13 DIAGNOSIS — Z8249 Family history of ischemic heart disease and other diseases of the circulatory system: Secondary | ICD-10-CM

## 2022-05-13 DIAGNOSIS — F32A Depression, unspecified: Secondary | ICD-10-CM | POA: Diagnosis not present

## 2022-05-13 DIAGNOSIS — K5900 Constipation, unspecified: Secondary | ICD-10-CM | POA: Diagnosis not present

## 2022-05-13 DIAGNOSIS — F419 Anxiety disorder, unspecified: Secondary | ICD-10-CM | POA: Diagnosis not present

## 2022-05-13 DIAGNOSIS — Z1211 Encounter for screening for malignant neoplasm of colon: Secondary | ICD-10-CM | POA: Diagnosis not present

## 2022-05-18 DIAGNOSIS — R310 Gross hematuria: Secondary | ICD-10-CM | POA: Diagnosis not present

## 2022-05-18 DIAGNOSIS — N393 Stress incontinence (female) (male): Secondary | ICD-10-CM | POA: Diagnosis not present

## 2022-05-19 DIAGNOSIS — R310 Gross hematuria: Secondary | ICD-10-CM | POA: Diagnosis not present

## 2022-05-26 DIAGNOSIS — R310 Gross hematuria: Secondary | ICD-10-CM | POA: Diagnosis not present

## 2022-06-02 DIAGNOSIS — R310 Gross hematuria: Secondary | ICD-10-CM | POA: Diagnosis not present

## 2022-06-02 DIAGNOSIS — Z87448 Personal history of other diseases of urinary system: Secondary | ICD-10-CM | POA: Diagnosis not present

## 2022-06-02 DIAGNOSIS — R82998 Other abnormal findings in urine: Secondary | ICD-10-CM | POA: Diagnosis not present

## 2022-06-02 DIAGNOSIS — N393 Stress incontinence (female) (male): Secondary | ICD-10-CM | POA: Diagnosis not present

## 2022-06-21 DIAGNOSIS — Z79899 Other long term (current) drug therapy: Secondary | ICD-10-CM | POA: Diagnosis not present

## 2022-06-21 DIAGNOSIS — L639 Alopecia areata, unspecified: Secondary | ICD-10-CM | POA: Diagnosis not present

## 2022-06-21 DIAGNOSIS — M1991 Primary osteoarthritis, unspecified site: Secondary | ICD-10-CM | POA: Diagnosis not present

## 2022-06-22 DIAGNOSIS — R051 Acute cough: Secondary | ICD-10-CM | POA: Diagnosis not present

## 2022-06-30 DIAGNOSIS — D122 Benign neoplasm of ascending colon: Secondary | ICD-10-CM | POA: Diagnosis not present

## 2022-06-30 DIAGNOSIS — Z1211 Encounter for screening for malignant neoplasm of colon: Secondary | ICD-10-CM | POA: Diagnosis not present

## 2022-06-30 DIAGNOSIS — K635 Polyp of colon: Secondary | ICD-10-CM | POA: Diagnosis not present

## 2022-07-15 DIAGNOSIS — M25851 Other specified joint disorders, right hip: Secondary | ICD-10-CM | POA: Diagnosis not present

## 2022-07-21 DIAGNOSIS — F330 Major depressive disorder, recurrent, mild: Secondary | ICD-10-CM | POA: Diagnosis not present

## 2022-07-21 DIAGNOSIS — E663 Overweight: Secondary | ICD-10-CM | POA: Diagnosis not present

## 2022-07-27 DIAGNOSIS — M25551 Pain in right hip: Secondary | ICD-10-CM | POA: Diagnosis not present

## 2022-07-28 DIAGNOSIS — M24851 Other specific joint derangements of right hip, not elsewhere classified: Secondary | ICD-10-CM | POA: Diagnosis not present

## 2022-07-28 DIAGNOSIS — M12851 Other specific arthropathies, not elsewhere classified, right hip: Secondary | ICD-10-CM | POA: Diagnosis not present

## 2022-08-04 DIAGNOSIS — M24851 Other specific joint derangements of right hip, not elsewhere classified: Secondary | ICD-10-CM | POA: Diagnosis not present

## 2022-08-04 DIAGNOSIS — M12851 Other specific arthropathies, not elsewhere classified, right hip: Secondary | ICD-10-CM | POA: Diagnosis not present

## 2022-08-06 DIAGNOSIS — M24851 Other specific joint derangements of right hip, not elsewhere classified: Secondary | ICD-10-CM | POA: Diagnosis not present

## 2022-08-06 DIAGNOSIS — M12851 Other specific arthropathies, not elsewhere classified, right hip: Secondary | ICD-10-CM | POA: Diagnosis not present

## 2022-08-11 DIAGNOSIS — M12851 Other specific arthropathies, not elsewhere classified, right hip: Secondary | ICD-10-CM | POA: Diagnosis not present

## 2022-08-11 DIAGNOSIS — M24851 Other specific joint derangements of right hip, not elsewhere classified: Secondary | ICD-10-CM | POA: Diagnosis not present

## 2022-08-12 ENCOUNTER — Other Ambulatory Visit: Payer: Self-pay | Admitting: Obstetrics and Gynecology

## 2022-08-12 DIAGNOSIS — Z6828 Body mass index (BMI) 28.0-28.9, adult: Secondary | ICD-10-CM | POA: Diagnosis not present

## 2022-08-12 DIAGNOSIS — N951 Menopausal and female climacteric states: Secondary | ICD-10-CM

## 2022-08-12 DIAGNOSIS — Z01419 Encounter for gynecological examination (general) (routine) without abnormal findings: Secondary | ICD-10-CM | POA: Diagnosis not present

## 2022-08-12 DIAGNOSIS — E2839 Other primary ovarian failure: Secondary | ICD-10-CM

## 2022-08-12 DIAGNOSIS — Z113 Encounter for screening for infections with a predominantly sexual mode of transmission: Secondary | ICD-10-CM | POA: Diagnosis not present

## 2022-08-12 DIAGNOSIS — Z01411 Encounter for gynecological examination (general) (routine) with abnormal findings: Secondary | ICD-10-CM | POA: Diagnosis not present

## 2022-08-12 DIAGNOSIS — Z124 Encounter for screening for malignant neoplasm of cervix: Secondary | ICD-10-CM | POA: Diagnosis not present

## 2022-08-13 DIAGNOSIS — M12851 Other specific arthropathies, not elsewhere classified, right hip: Secondary | ICD-10-CM | POA: Diagnosis not present

## 2022-08-13 DIAGNOSIS — M24851 Other specific joint derangements of right hip, not elsewhere classified: Secondary | ICD-10-CM | POA: Diagnosis not present

## 2022-08-17 DIAGNOSIS — M24851 Other specific joint derangements of right hip, not elsewhere classified: Secondary | ICD-10-CM | POA: Diagnosis not present

## 2022-08-17 DIAGNOSIS — M12851 Other specific arthropathies, not elsewhere classified, right hip: Secondary | ICD-10-CM | POA: Diagnosis not present

## 2022-08-20 DIAGNOSIS — M25551 Pain in right hip: Secondary | ICD-10-CM | POA: Diagnosis not present

## 2022-08-24 DIAGNOSIS — M24851 Other specific joint derangements of right hip, not elsewhere classified: Secondary | ICD-10-CM | POA: Diagnosis not present

## 2022-08-24 DIAGNOSIS — M12851 Other specific arthropathies, not elsewhere classified, right hip: Secondary | ICD-10-CM | POA: Diagnosis not present

## 2022-09-28 DIAGNOSIS — R7303 Prediabetes: Secondary | ICD-10-CM | POA: Diagnosis not present

## 2022-10-06 DIAGNOSIS — Z713 Dietary counseling and surveillance: Secondary | ICD-10-CM | POA: Diagnosis not present

## 2022-10-06 DIAGNOSIS — Z6828 Body mass index (BMI) 28.0-28.9, adult: Secondary | ICD-10-CM | POA: Diagnosis not present

## 2022-10-06 DIAGNOSIS — Z7182 Exercise counseling: Secondary | ICD-10-CM | POA: Diagnosis not present

## 2022-10-06 DIAGNOSIS — E663 Overweight: Secondary | ICD-10-CM | POA: Diagnosis not present

## 2022-10-06 DIAGNOSIS — F411 Generalized anxiety disorder: Secondary | ICD-10-CM | POA: Diagnosis not present

## 2022-11-10 DIAGNOSIS — Z6827 Body mass index (BMI) 27.0-27.9, adult: Secondary | ICD-10-CM | POA: Diagnosis not present

## 2022-11-10 DIAGNOSIS — Z713 Dietary counseling and surveillance: Secondary | ICD-10-CM | POA: Diagnosis not present

## 2022-11-10 DIAGNOSIS — R7303 Prediabetes: Secondary | ICD-10-CM | POA: Diagnosis not present

## 2022-11-10 DIAGNOSIS — Z6828 Body mass index (BMI) 28.0-28.9, adult: Secondary | ICD-10-CM | POA: Diagnosis not present

## 2022-11-10 DIAGNOSIS — E663 Overweight: Secondary | ICD-10-CM | POA: Diagnosis not present

## 2022-11-17 ENCOUNTER — Other Ambulatory Visit: Payer: Self-pay | Admitting: Sports Medicine

## 2022-11-17 DIAGNOSIS — M25551 Pain in right hip: Secondary | ICD-10-CM

## 2022-11-24 DIAGNOSIS — Z6827 Body mass index (BMI) 27.0-27.9, adult: Secondary | ICD-10-CM | POA: Diagnosis not present

## 2022-11-24 DIAGNOSIS — R7303 Prediabetes: Secondary | ICD-10-CM | POA: Diagnosis not present

## 2022-11-24 DIAGNOSIS — E782 Mixed hyperlipidemia: Secondary | ICD-10-CM | POA: Diagnosis not present

## 2022-12-12 ENCOUNTER — Ambulatory Visit
Admission: RE | Admit: 2022-12-12 | Discharge: 2022-12-12 | Disposition: A | Discharge status: Home / Self Care | Payer: Federal, State, Local not specified - PPO | Source: Ambulatory Visit | Attending: Sports Medicine | Admitting: Sports Medicine

## 2022-12-12 DIAGNOSIS — M25551 Pain in right hip: Secondary | ICD-10-CM

## 2022-12-20 DIAGNOSIS — R7303 Prediabetes: Secondary | ICD-10-CM | POA: Diagnosis not present

## 2022-12-20 DIAGNOSIS — E782 Mixed hyperlipidemia: Secondary | ICD-10-CM | POA: Diagnosis not present

## 2022-12-20 DIAGNOSIS — E663 Overweight: Secondary | ICD-10-CM | POA: Diagnosis not present

## 2022-12-20 DIAGNOSIS — Z6827 Body mass index (BMI) 27.0-27.9, adult: Secondary | ICD-10-CM | POA: Diagnosis not present

## 2022-12-20 DIAGNOSIS — Z713 Dietary counseling and surveillance: Secondary | ICD-10-CM | POA: Diagnosis not present

## 2022-12-22 DIAGNOSIS — M25551 Pain in right hip: Secondary | ICD-10-CM | POA: Diagnosis not present

## 2022-12-22 DIAGNOSIS — Z79899 Other long term (current) drug therapy: Secondary | ICD-10-CM | POA: Diagnosis not present

## 2022-12-22 DIAGNOSIS — L639 Alopecia areata, unspecified: Secondary | ICD-10-CM | POA: Diagnosis not present

## 2022-12-22 DIAGNOSIS — M1991 Primary osteoarthritis, unspecified site: Secondary | ICD-10-CM | POA: Diagnosis not present

## 2022-12-31 DIAGNOSIS — L578 Other skin changes due to chronic exposure to nonionizing radiation: Secondary | ICD-10-CM | POA: Diagnosis not present

## 2022-12-31 DIAGNOSIS — L570 Actinic keratosis: Secondary | ICD-10-CM | POA: Diagnosis not present

## 2022-12-31 DIAGNOSIS — L821 Other seborrheic keratosis: Secondary | ICD-10-CM | POA: Diagnosis not present

## 2022-12-31 DIAGNOSIS — L719 Rosacea, unspecified: Secondary | ICD-10-CM | POA: Diagnosis not present

## 2022-12-31 DIAGNOSIS — D225 Melanocytic nevi of trunk: Secondary | ICD-10-CM | POA: Diagnosis not present

## 2023-01-17 DIAGNOSIS — R7303 Prediabetes: Secondary | ICD-10-CM | POA: Diagnosis not present

## 2023-01-17 DIAGNOSIS — Z6826 Body mass index (BMI) 26.0-26.9, adult: Secondary | ICD-10-CM | POA: Diagnosis not present

## 2023-01-17 DIAGNOSIS — E6609 Other obesity due to excess calories: Secondary | ICD-10-CM | POA: Diagnosis not present

## 2023-02-14 DIAGNOSIS — Z6825 Body mass index (BMI) 25.0-25.9, adult: Secondary | ICD-10-CM | POA: Diagnosis not present

## 2023-02-14 DIAGNOSIS — E782 Mixed hyperlipidemia: Secondary | ICD-10-CM | POA: Diagnosis not present

## 2023-02-14 DIAGNOSIS — E663 Overweight: Secondary | ICD-10-CM | POA: Diagnosis not present

## 2023-03-01 ENCOUNTER — Other Ambulatory Visit: Payer: Self-pay | Admitting: Obstetrics and Gynecology

## 2023-03-01 DIAGNOSIS — Z1231 Encounter for screening mammogram for malignant neoplasm of breast: Secondary | ICD-10-CM

## 2023-03-08 ENCOUNTER — Ambulatory Visit
Admission: RE | Admit: 2023-03-08 | Discharge: 2023-03-08 | Disposition: A | Discharge status: Home / Self Care | Payer: Federal, State, Local not specified - PPO | Source: Ambulatory Visit | Attending: Obstetrics and Gynecology | Admitting: Obstetrics and Gynecology

## 2023-03-08 DIAGNOSIS — Z1231 Encounter for screening mammogram for malignant neoplasm of breast: Secondary | ICD-10-CM | POA: Diagnosis not present

## 2023-03-16 DIAGNOSIS — R7303 Prediabetes: Secondary | ICD-10-CM | POA: Diagnosis not present

## 2023-03-16 DIAGNOSIS — Z6824 Body mass index (BMI) 24.0-24.9, adult: Secondary | ICD-10-CM | POA: Diagnosis not present

## 2023-03-16 DIAGNOSIS — E663 Overweight: Secondary | ICD-10-CM | POA: Diagnosis not present

## 2023-03-16 DIAGNOSIS — Z79899 Other long term (current) drug therapy: Secondary | ICD-10-CM | POA: Diagnosis not present

## 2023-04-15 DIAGNOSIS — Z0000 Encounter for general adult medical examination without abnormal findings: Secondary | ICD-10-CM | POA: Diagnosis not present

## 2023-04-15 DIAGNOSIS — Z13220 Encounter for screening for lipoid disorders: Secondary | ICD-10-CM | POA: Diagnosis not present

## 2023-04-20 DIAGNOSIS — E782 Mixed hyperlipidemia: Secondary | ICD-10-CM | POA: Diagnosis not present

## 2023-04-20 DIAGNOSIS — R7303 Prediabetes: Secondary | ICD-10-CM | POA: Diagnosis not present

## 2023-04-20 DIAGNOSIS — Z7689 Persons encountering health services in other specified circumstances: Secondary | ICD-10-CM | POA: Diagnosis not present

## 2023-04-20 DIAGNOSIS — Z6824 Body mass index (BMI) 24.0-24.9, adult: Secondary | ICD-10-CM | POA: Diagnosis not present

## 2023-06-22 DIAGNOSIS — L639 Alopecia areata, unspecified: Secondary | ICD-10-CM | POA: Diagnosis not present

## 2023-06-22 DIAGNOSIS — M1991 Primary osteoarthritis, unspecified site: Secondary | ICD-10-CM | POA: Diagnosis not present

## 2023-06-22 DIAGNOSIS — Z79899 Other long term (current) drug therapy: Secondary | ICD-10-CM | POA: Diagnosis not present

## 2023-06-27 DIAGNOSIS — R7303 Prediabetes: Secondary | ICD-10-CM | POA: Diagnosis not present

## 2023-06-27 DIAGNOSIS — Z6823 Body mass index (BMI) 23.0-23.9, adult: Secondary | ICD-10-CM | POA: Diagnosis not present

## 2023-06-27 DIAGNOSIS — Z7689 Persons encountering health services in other specified circumstances: Secondary | ICD-10-CM | POA: Diagnosis not present

## 2023-06-27 DIAGNOSIS — E782 Mixed hyperlipidemia: Secondary | ICD-10-CM | POA: Diagnosis not present

## 2023-09-22 DIAGNOSIS — R7303 Prediabetes: Secondary | ICD-10-CM | POA: Diagnosis not present

## 2023-09-22 DIAGNOSIS — Z8639 Personal history of other endocrine, nutritional and metabolic disease: Secondary | ICD-10-CM | POA: Diagnosis not present

## 2023-09-22 DIAGNOSIS — Z7689 Persons encountering health services in other specified circumstances: Secondary | ICD-10-CM | POA: Diagnosis not present

## 2023-09-22 DIAGNOSIS — Z6823 Body mass index (BMI) 23.0-23.9, adult: Secondary | ICD-10-CM | POA: Diagnosis not present

## 2023-10-17 DIAGNOSIS — Z124 Encounter for screening for malignant neoplasm of cervix: Secondary | ICD-10-CM | POA: Diagnosis not present

## 2023-10-17 DIAGNOSIS — Z01411 Encounter for gynecological examination (general) (routine) with abnormal findings: Secondary | ICD-10-CM | POA: Diagnosis not present

## 2023-10-17 DIAGNOSIS — Z01419 Encounter for gynecological examination (general) (routine) without abnormal findings: Secondary | ICD-10-CM | POA: Diagnosis not present

## 2023-10-17 DIAGNOSIS — Z113 Encounter for screening for infections with a predominantly sexual mode of transmission: Secondary | ICD-10-CM | POA: Diagnosis not present

## 2023-10-17 DIAGNOSIS — Z1331 Encounter for screening for depression: Secondary | ICD-10-CM | POA: Diagnosis not present

## 2023-10-18 ENCOUNTER — Other Ambulatory Visit: Payer: Self-pay | Admitting: Obstetrics and Gynecology

## 2023-10-18 DIAGNOSIS — N951 Menopausal and female climacteric states: Secondary | ICD-10-CM

## 2023-10-27 DIAGNOSIS — H40013 Open angle with borderline findings, low risk, bilateral: Secondary | ICD-10-CM | POA: Diagnosis not present

## 2023-10-27 DIAGNOSIS — H2513 Age-related nuclear cataract, bilateral: Secondary | ICD-10-CM | POA: Diagnosis not present

## 2023-11-21 ENCOUNTER — Other Ambulatory Visit: Payer: Self-pay | Admitting: Internal Medicine

## 2023-11-21 DIAGNOSIS — E042 Nontoxic multinodular goiter: Secondary | ICD-10-CM

## 2023-11-22 ENCOUNTER — Ambulatory Visit
Admission: RE | Admit: 2023-11-22 | Discharge: 2023-11-22 | Disposition: A | Discharge status: Home / Self Care | Payer: Federal, State, Local not specified - PPO | Source: Ambulatory Visit | Attending: Internal Medicine | Admitting: Internal Medicine

## 2023-11-22 DIAGNOSIS — E042 Nontoxic multinodular goiter: Secondary | ICD-10-CM

## 2024-02-15 ENCOUNTER — Other Ambulatory Visit: Payer: Self-pay | Admitting: Obstetrics and Gynecology

## 2024-02-15 DIAGNOSIS — Z1231 Encounter for screening mammogram for malignant neoplasm of breast: Secondary | ICD-10-CM

## 2024-03-13 ENCOUNTER — Ambulatory Visit
Admission: RE | Admit: 2024-03-13 | Discharge: 2024-03-13 | Disposition: A | Discharge status: Home / Self Care | Source: Ambulatory Visit | Attending: Obstetrics and Gynecology | Admitting: Obstetrics and Gynecology

## 2024-03-13 ENCOUNTER — Ambulatory Visit

## 2024-03-13 DIAGNOSIS — Z1231 Encounter for screening mammogram for malignant neoplasm of breast: Secondary | ICD-10-CM

## 2024-06-06 ENCOUNTER — Other Ambulatory Visit: Payer: Federal, State, Local not specified - PPO

## 2024-07-10 ENCOUNTER — Other Ambulatory Visit (HOSPITAL_BASED_OUTPATIENT_CLINIC_OR_DEPARTMENT_OTHER)

## 2024-11-16 ENCOUNTER — Ambulatory Visit (HOSPITAL_BASED_OUTPATIENT_CLINIC_OR_DEPARTMENT_OTHER)
Admission: RE | Admit: 2024-11-16 | Discharge: 2024-11-16 | Disposition: A | Source: Ambulatory Visit | Attending: Obstetrics and Gynecology | Admitting: Obstetrics and Gynecology

## 2024-11-16 DIAGNOSIS — N951 Menopausal and female climacteric states: Secondary | ICD-10-CM
